# Patient Record
Sex: Male | Born: 1980 | Race: White | Hispanic: No | Marital: Single | Smoking: Former smoker
Health system: Southern US, Community
[De-identification: ages and names within clinical notes are randomized; demographics above are authoritative.]

## PROBLEM LIST (undated history)

## (undated) DIAGNOSIS — S8292XA Unspecified fracture of left lower leg, initial encounter for closed fracture: Secondary | ICD-10-CM

## (undated) DIAGNOSIS — I10 Essential (primary) hypertension: Secondary | ICD-10-CM

---

## 2007-08-30 ENCOUNTER — Emergency Department (HOSPITAL_COMMUNITY): Admission: EM | Admit: 2007-08-30 | Discharge: 2007-08-30 | Payer: Self-pay | Admitting: Emergency Medicine

## 2012-01-26 ENCOUNTER — Emergency Department (HOSPITAL_COMMUNITY)
Admission: EM | Admit: 2012-01-26 | Discharge: 2012-01-26 | Disposition: A | Discharge status: Home / Self Care | Payer: Worker's Compensation | Attending: Emergency Medicine | Admitting: Emergency Medicine

## 2012-01-26 ENCOUNTER — Encounter (HOSPITAL_COMMUNITY): Payer: Self-pay | Admitting: *Deleted

## 2012-01-26 DIAGNOSIS — F17200 Nicotine dependence, unspecified, uncomplicated: Secondary | ICD-10-CM | POA: Insufficient documentation

## 2012-01-26 DIAGNOSIS — W268XXA Contact with other sharp object(s), not elsewhere classified, initial encounter: Secondary | ICD-10-CM | POA: Insufficient documentation

## 2012-01-26 DIAGNOSIS — S61409A Unspecified open wound of unspecified hand, initial encounter: Secondary | ICD-10-CM | POA: Insufficient documentation

## 2012-01-26 DIAGNOSIS — Y929 Unspecified place or not applicable: Secondary | ICD-10-CM | POA: Insufficient documentation

## 2012-01-26 DIAGNOSIS — Y9389 Activity, other specified: Secondary | ICD-10-CM | POA: Insufficient documentation

## 2012-01-26 DIAGNOSIS — IMO0002 Reserved for concepts with insufficient information to code with codable children: Secondary | ICD-10-CM

## 2012-01-26 NOTE — ED Notes (Addendum)
Pt says cut his right hand on a tin can.

## 2012-01-26 NOTE — ED Provider Notes (Signed)
History   This chart was scribed for non-physician practitioner working with Hurman Horn, MD by Smitty Pluck. This patient was seen in room WTR5 and the patient's care was started at 10:55 PM.    CSN: [id number]  Arrival date & time 01/26/12  2227     Chief Complaint  Patient presents with  . Laceration     The history is provided by the patient. No language interpreter was used.   Jose Mclaughlin is a 32 y.o. male who presents to the Emergency Department complaining of right hand laceration today 10 minutes ago. Pt reports that he has mild right hand pain. He states he cut his right hand with tin can. Pt reports that his tetanus x3 years ago. . The bleeding is controlled. Pt denies reduced range of motion numbness or paresthesia.    History reviewed. No pertinent past medical history.  History reviewed. No pertinent past surgical history.  No family history on file.  History  Substance Use Topics  . Smoking status: Current Every Day Smoker  . Smokeless tobacco: Not on file  . Alcohol Use: Yes      Review of Systems  Constitutional: Negative for fever.  Respiratory: Negative for shortness of breath.   Cardiovascular: Negative for chest pain.  Gastrointestinal: Negative for nausea, vomiting, abdominal pain and diarrhea.  Skin: Positive for wound.  All other systems reviewed and are negative.    Allergies  Review of patient's allergies indicates not on file.  Home Medications  No current outpatient prescriptions on file.  BP 137/91  Pulse 80  Temp 98.2 F (36.8 C) (Oral)  Resp 16  SpO2 96%  Physical Exam  Nursing note and vitals reviewed. Constitutional: He is oriented to person, place, and time. He appears well-developed and well-nourished. No distress.  HENT:  Head: Normocephalic.  Eyes: Conjunctivae normal and EOM are normal.  Cardiovascular: Normal rate.   Pulmonary/Chest: Effort normal. No stridor.  Musculoskeletal: Normal range of motion.    Neurological: He is alert and oriented to person, place, and time.  Skin:       3.5 cm laceration with full thickness and exposed subcutaneous tissue. Full rom, flexion and extension Distal sensation intact Cap refill less than 3 seconds    Psychiatric: He has a normal mood and affect.    ED Course  Procedures (including critical care time) DIAGNOSTIC STUDIES: Oxygen Saturation is 96% on room air, adequate by my interpretation.    COORDINATION OF CARE: 10:54 PM Discussed ED treatment with pt and pt agrees.   LACERATION REPAIR Performed by: Wynetta Emery Authorized by: Wynetta Emery Consent: Verbal consent obtained. Risks and benefits: risks, benefits and alternatives were discussed Consent given by: patient Patient identity confirmed: Wrist band  Prepped and Draped in normal sterile fashion  Tetanus: Up to date   Laceration Location: Dorsum of right hand  Laceration Length: 3.5 cm  Anesthesia: Local   Local anesthetic: 2% with epinephrine  Anesthetic total: 4 ml  Irrigation method: syringe  Amount of cleaning: copious   Wound explored to depth in good light on a bloodless field with no foreign bodies seen or palpated.   Skin closure: 5-0 polypropylene   Number of sutures: 6   Technique: Running locking   Patient tolerance: Patient tolerated the procedure well with no immediate complications.  Antibx ointment applied. Instructions for care discussed verbally and patient provided with additional written instructions for homecare and f/u.     Labs Reviewed - No data to  display No results found.   1. Laceration       MDM   laceration to dorsum of right hand with no structural involvement. Closed with 6 running locking sutures. Return to questions given    I personally performed the services described in this documentation, which was scribed in my presence. The recorded information has been reviewed and is accurate.   Wynetta Emery,  PA-C 01/27/12 [phone]

## 2012-01-26 NOTE — ED Notes (Signed)
PA at bedside for suturing procedure 

## 2012-01-28 NOTE — ED Provider Notes (Signed)
Medical screening examination/treatment/procedure(s) were performed by non-physician practitioner and as supervising physician I was immediately available for consultation/collaboration.  Hurman Horn, MD 01/28/12 1350

## 2012-02-05 ENCOUNTER — Encounter (HOSPITAL_COMMUNITY): Payer: Self-pay | Admitting: *Deleted

## 2012-02-05 ENCOUNTER — Emergency Department (HOSPITAL_COMMUNITY)
Admission: EM | Admit: 2012-02-05 | Discharge: 2012-02-05 | Disposition: A | Discharge status: Home / Self Care | Payer: Worker's Compensation | Attending: Emergency Medicine | Admitting: Emergency Medicine

## 2012-02-05 DIAGNOSIS — F17200 Nicotine dependence, unspecified, uncomplicated: Secondary | ICD-10-CM | POA: Insufficient documentation

## 2012-02-05 DIAGNOSIS — Z4802 Encounter for removal of sutures: Secondary | ICD-10-CM | POA: Insufficient documentation

## 2012-02-05 NOTE — ED Provider Notes (Signed)
Medical screening examination/treatment/procedure(s) were performed by non-physician practitioner and as supervising physician I was immediately available for consultation/collaboration.   Gwyneth Sprout, MD 02/05/12 2026

## 2012-02-05 NOTE — ED Provider Notes (Signed)
History     CSN: [id number]  Arrival date & time 02/05/12  1314   First MD Initiated Contact with Patient 02/05/12 1327      No chief complaint on file.   (Consider location/radiation/quality/duration/timing/severity/associated sxs/prior treatment) Patient is a 32 y.o. male presenting with suture removal. The history is provided by the patient.  Suture / Staple Removal  Treated in ED: 10 days go. There has been no treatment since the wound repair. Fever duration: no fever. There has been no drainage from the wound. There is no redness present. There is no swelling present. The pain has no pain. Difficulty Moving Extremity/Digit: No difficulty moving all fingers of the right hand.    History reviewed. No pertinent past medical history.  History reviewed. No pertinent past surgical history.  History reviewed. No pertinent family history.  History  Substance Use Topics  . Smoking status: Current Every Day Smoker  . Smokeless tobacco: Not on file  . Alcohol Use: Yes      Review of Systems  Constitutional: Negative for fever and chills.  Skin: Positive for wound. Negative for color change.  Neurological: Negative for numbness.  All other systems reviewed and are negative.    Allergies  Review of patient's allergies indicates no known allergies.  Home Medications   Current Outpatient Rx  Name  Route  Sig  Dispense  Refill  . IBUPROFEN 200 MG PO TABS   Oral   Take 800 mg by mouth every 6 (six) hours as needed.           BP 128/77  Pulse 82  Temp 98 F (36.7 C) (Oral)  Resp 18  SpO2 97%  Physical Exam  Nursing note and vitals reviewed. Constitutional: He appears well-developed and well-nourished. No distress.  HENT:  Head: Normocephalic and atraumatic.  Mouth/Throat: Oropharynx is clear and moist.  Neck: Normal range of motion. Neck supple.  Cardiovascular: Normal rate, regular rhythm and normal heart sounds.   Pulmonary/Chest: Effort normal and breath  sounds normal.  Neurological: He is alert.  Skin: Skin is warm and dry. He is not diaphoretic.       Laceration of right hand appears to be well healed.  Good approximation.  No surrounding erythema, edema, warmth, or induration.  No drainage.  Psychiatric: He has a normal mood and affect.    ED Course  Procedures (including critical care time)  Labs Reviewed - No data to display No results found.   No diagnosis found.    MDM  Patient presenting to have sutures removed.  No signs of infection.  Sutures removed without difficulty.  Laceration well healed.          Pascal Lux [place], PA-C 02/05/12 1712

## 2012-02-05 NOTE — ED Notes (Signed)
Pt needs stiches removal

## 2014-06-06 ENCOUNTER — Emergency Department (HOSPITAL_COMMUNITY)
Admission: EM | Admit: 2014-06-06 | Discharge: 2014-06-07 | Disposition: A | Discharge status: Home / Self Care | Payer: Worker's Compensation | Attending: Emergency Medicine | Admitting: Emergency Medicine

## 2014-06-06 ENCOUNTER — Encounter (HOSPITAL_COMMUNITY): Payer: Self-pay | Admitting: Emergency Medicine

## 2014-06-06 DIAGNOSIS — Y939 Activity, unspecified: Secondary | ICD-10-CM | POA: Insufficient documentation

## 2014-06-06 DIAGNOSIS — S61212A Laceration without foreign body of right middle finger without damage to nail, initial encounter: Secondary | ICD-10-CM | POA: Diagnosis not present

## 2014-06-06 DIAGNOSIS — Y990 Civilian activity done for income or pay: Secondary | ICD-10-CM | POA: Insufficient documentation

## 2014-06-06 DIAGNOSIS — Z720 Tobacco use: Secondary | ICD-10-CM | POA: Insufficient documentation

## 2014-06-06 DIAGNOSIS — Y288XXA Contact with other sharp object, undetermined intent, initial encounter: Secondary | ICD-10-CM | POA: Insufficient documentation

## 2014-06-06 DIAGNOSIS — Y929 Unspecified place or not applicable: Secondary | ICD-10-CM | POA: Insufficient documentation

## 2014-06-06 MED ORDER — LIDOCAINE HCL (PF) 1 % IJ SOLN
5.0000 mL | Freq: Once | INTRAMUSCULAR | Status: AC
  Administered 2014-06-07: 5 mL via INTRADERMAL
  Filled 2014-06-06: qty 5

## 2014-06-06 NOTE — ED Provider Notes (Signed)
CSN: [id number]     Arrival date & time 06/06/14  2248 History   None   This chart was scribed for non-physician practitioner, Emilia Beck, PA-C, working with Geoffery Lyons, MD by Marica Otter, ED Scribe. This patient was seen in room [facility] and the patient's care was started at 11:27 PM.  No chief complaint on file.  The history is provided by the patient. No language interpreter was used.   PCP: No primary care provider on file. HPI Comments: Jose Mclaughlin is a 34 y.o. male who presents to the Emergency Department complaining of laceration to the right, middle finger sustained today when pt cut his finger on a metal equipment at work. Pt also complains of associated non-radiating sharp pain. Pt denies any other injury or Sx at this time.   No past medical history on file. No past surgical history on file. No family history on file. History  Substance Use Topics  . Smoking status: Current Every Day Smoker  . Smokeless tobacco: Not on file  . Alcohol Use: Yes    Review of Systems  Musculoskeletal:       Pain to right middle finger  Skin: Positive for wound (laceration to right, middle finger ).  All other systems reviewed and are negative.     Allergies  Review of patient's allergies indicates no known allergies.  Home Medications   Prior to Admission medications   Medication Sig Start Date End Date Taking? Authorizing Provider  ibuprofen (ADVIL,MOTRIN) 200 MG tablet Take 800 mg by mouth every 6 (six) hours as needed.    Historical Provider, MD   Triage Vitals: BP 135/78 mmHg  Pulse 79  Temp(Src) 98.2 F (36.8 C) (Oral)  Resp 16  SpO2 97% Physical Exam  Constitutional: He is oriented to person, place, and time. He appears well-developed and well-nourished. No distress.  HENT:  Head: Normocephalic and atraumatic.  Eyes: Conjunctivae and EOM are normal.  Neck: Neck supple. No tracheal deviation present.  Cardiovascular: Normal rate and regular rhythm.  Exam  reveals no gallop and no friction rub.   No murmur heard. Pulmonary/Chest: Effort normal and breath sounds normal. No respiratory distress. He has no wheezes. He has no rales. He exhibits no tenderness.  Abdominal: Soft. He exhibits no distension. There is no tenderness. There is no rebound.  Musculoskeletal: Normal range of motion.  Full active ROM of the right middle finger. No obvious deformity.   Neurological: He is alert and oriented to person, place, and time.  Speech is goal-oriented. Moves limbs without ataxia.   Skin: Skin is warm and dry.  2cm laceration of the dorsal right middle finger with bleeding controlled.   Psychiatric: He has a normal mood and affect. His behavior is normal.  Nursing note and vitals reviewed.   ED Course  Procedures (including critical care time) DIAGNOSTIC STUDIES: Oxygen Saturation is 97% on RA, nl by my interpretation.    COORDINATION OF CARE: 11:27 PM-Discussed treatment plan which includes laceration repair  LACERATION REPAIR Performed by: Emilia Beck Authorized by: Emilia Beck Consent: Verbal consent obtained. Risks and benefits: risks, benefits and alternatives were discussed Consent given by: patient Patient identity confirmed: provided demographic data Prepped and Draped in normal sterile fashion Wound explored  Laceration Location: dorsal right middle finger  Laceration Length: 2cm  No Foreign Bodies seen or palpated  Anesthesia: local infiltration  Local anesthetic: lidocaine 1% without epinephrine  Anesthetic total: 2 ml  Irrigation method: syringe Amount of cleaning: standard  Skin closure: 4-0 prolene  Number of sutures: 2  Technique: simple  Patient tolerance: Patient tolerated the procedure well with no immediate complications.   Labs Review Labs Reviewed - No data to display  Imaging Review No results found.   EKG Interpretation None      MDM   Final diagnoses:  Laceration of right  middle finger w/o foreign body w/o damage to nail, initial encounter    12:42 AM Patient's laceration repaired without difficulty. No neurovascular compromise. Tetanus UTD. No other injury.   I personally performed the services described in this documentation, which was scribed in my presence. The recorded information has been reviewed and is accurate.    Emilia BeckKaitlyn Naziyah Tieszen, PA-C 06/07/14 0045  Geoffery Lyonsouglas Delo, MD 06/09/14 [phone]

## 2014-06-06 NOTE — ED Notes (Signed)
Bed: [facility]B Expected date:  Expected time:  Means of arrival:  Comments: 

## 2014-06-06 NOTE — ED Notes (Signed)
Patient states he bumped his hand into a piece of equipment at work and cut his hand open "pretty good". Hand is currently bandaged. Family at bedside. While setting in triage patient experienced +LOC for approx 20-30 seconds. Family at bedside states patient lost control of his head and struck it against the wall a few times. Patient alert & oriented at this time. Patient was drinking a beverage on arrival to treatment area, patient was requested to not drink any additional beverages at this time.

## 2014-06-07 MED ORDER — CEPHALEXIN 500 MG PO CAPS: 500.0000 mg | ORAL_CAPSULE | Freq: Four times a day (QID) | ORAL | Status: DC

## 2014-06-07 NOTE — Discharge Instructions (Signed)
Take keflex as directed until gone. Refer to attached documents for more information.

## 2014-06-07 NOTE — ED Notes (Signed)
PA student at bedside for lac repair.

## 2015-01-23 ENCOUNTER — Emergency Department (HOSPITAL_COMMUNITY): Payer: Self-pay

## 2015-01-23 ENCOUNTER — Emergency Department (HOSPITAL_COMMUNITY)
Admission: EM | Admit: 2015-01-23 | Discharge: 2015-01-23 | Disposition: A | Discharge status: Home / Self Care | Payer: Self-pay | Attending: Emergency Medicine | Admitting: Emergency Medicine

## 2015-01-23 ENCOUNTER — Encounter (HOSPITAL_COMMUNITY): Payer: Self-pay | Admitting: *Deleted

## 2015-01-23 DIAGNOSIS — S82832A Other fracture of upper and lower end of left fibula, initial encounter for closed fracture: Secondary | ICD-10-CM

## 2015-01-23 DIAGNOSIS — Y998 Other external cause status: Secondary | ICD-10-CM | POA: Insufficient documentation

## 2015-01-23 DIAGNOSIS — Y92828 Other wilderness area as the place of occurrence of the external cause: Secondary | ICD-10-CM | POA: Insufficient documentation

## 2015-01-23 DIAGNOSIS — W010XXA Fall on same level from slipping, tripping and stumbling without subsequent striking against object, initial encounter: Secondary | ICD-10-CM | POA: Insufficient documentation

## 2015-01-23 DIAGNOSIS — F17210 Nicotine dependence, cigarettes, uncomplicated: Secondary | ICD-10-CM | POA: Insufficient documentation

## 2015-01-23 DIAGNOSIS — Z792 Long term (current) use of antibiotics: Secondary | ICD-10-CM | POA: Insufficient documentation

## 2015-01-23 DIAGNOSIS — Y9301 Activity, walking, marching and hiking: Secondary | ICD-10-CM | POA: Insufficient documentation

## 2015-01-23 MED ORDER — HYDROCODONE-ACETAMINOPHEN 5-325 MG PO TABS
1.0000 | ORAL_TABLET | Freq: Once | ORAL | Status: AC
  Administered 2015-01-23: 1 via ORAL
  Filled 2015-01-23: qty 1

## 2015-01-23 MED ORDER — HYDROCODONE-ACETAMINOPHEN 5-325 MG PO TABS: 2.0000 | ORAL_TABLET | ORAL | Status: DC | PRN

## 2015-01-23 NOTE — ED Notes (Signed)
To x-ray

## 2015-01-23 NOTE — Progress Notes (Signed)
Orthopedic Tech Progress Note Patient Details:  Jose Mclaughlin August 12, 1980 [id number]  Ortho Devices Type of Ortho Device: Ace wrap, Stirrup splint Ortho Device/Splint Location: LLE Ortho Device/Splint Interventions: Ordered, Application   Jennye Moccasin 01/23/2015, 7:16 PM

## 2015-01-23 NOTE — Discharge Instructions (Signed)
Fibular Fracture With Rehab °The fibula is the smaller of the two lower leg bones and is vulnerable to breaks (fracture). Fibular fractures may go fully through the bone (complete) or partially (incomplete). The bone fragments are rarely out of alignment (displaced fracture). Fibula fractures may occur anywhere along the bone. However, this document only discusses fractures that do not involve a leg joint. Fibular fractures are not often a severe injury because the bone supports only about 17% of the body weight. °SYMPTOMS  °· Moderate to severe pain in the lower leg. °· Tenderness and swelling in the leg or calf. °· Bleeding and/or bruising (contusion) in the leg. °· Inability to bear weight on the injured extremity. °· Visible deformity, if the fracture is displaced. °· Numbness and coldness in the leg and foot, beyond the fracture site, if blood supply is impaired. °CAUSES  °Fractures occur when a force is placed on the bone that is greater than it can withstand. Common causes of fibular fracture include: °· Direct hit (trauma) (i.e., hockey or lacrosse check to the lower leg). °· Stress fracture (weakening of the bone from repeated stress). °· Indirect injury, caused by twisting, turning quickly, or violent muscle contraction. °RISK INCREASES WITH: °· Contact sports (i.e., football, soccer, lacrosse, hockey). °· Sports that can cause twisted ankle injury (i.e., skiing, basketball). °· Bony abnormalities (i.e., osteoporosis or bone tumors). °· Metabolism disorders, hormone problems, and nutrition deficiency and disorders (i.e., anorexia and bulimia). °· Poor strength and flexibility. °PREVENTION  °· Warm up and stretch properly before activity. °· Maintain physical fitness: °¨ Strength, flexibility, and endurance. °¨ Cardiovascular fitness. °· Wear properly fitted and padded protective equipment (i.e., shin guards for soccer). °PROGNOSIS  °If treated properly, fibular fractures usually heal in 4 to 6 weeks.    °RELATED COMPLICATIONS  °· Failure of bone to heal (nonunion). °· Bone heals in a poor position (malunion). °· Increased pressure inside the leg (compartment syndrome) due to injury that disrupts the blood supply to the leg and foot and injures the nerves and muscles (uncommon). °· Shortening of the injured bones. °· Hindrance of normal bone growth in children. °· Risks of surgery: infection, bleeding, injury to nerves (numbness, weakness, paralysis), need for further surgery. °· Longer healing time if activity is resumed too soon. °TREATMENT °Treatment first involves ice, medicine, and elevation of the leg to reduce pain and inflammation. People with fibular fractures are advised to walk using crutches. A brace or walking boot may be given to restrain the injured leg and allow for healing. Sometimes, surgery is needed to place a rod, plate, or screws in the bones in order to fix the fracture. After surgery, the leg is restrained. After restraint (with or without surgery), it is important to complete strengthening and stretching exercises to regain strength and a full range of motion. Exercises may be completed at home or with a therapist. °MEDICATION  °· If pain medicine is needed, nonsteroidal anti-inflammatory medicines (aspirin and ibuprofen), or other minor pain relievers (acetaminophen), are often advised. °· Do not take pain medicine for 7 days before surgery. °· Prescription pain relievers may be given if your health care provider thinks they are needed. Use only as directed and only as much as you need. °SEEK MEDICAL CARE IF: °· Symptoms get worse or do not improve in 2 weeks, despite treatment. °· The following occur after restraint or surgery. (Report any of these signs immediately): °¨ Swelling above or below the fracture site. °¨ Severe, persistent pain. °¨   Blue or gray skin below the fracture site, especially under the toenails. Numbness or loss of feeling below the fracture site. °¨ New, unexplained  symptoms develop. (Drugs used in treatment may produce side effects.) °EXERCISES  °RANGE OF MOTION (ROM) AND STRETCHING EXERCISES - Fibular Fracture °These exercises may help you when beginning to recover from your injury. Your symptoms may go away with or without further involvement from your physician, physical therapist or athletic trainer. While completing these exercises, remember:  °· Restoring tissue flexibility helps normal motion to return to the joints. This allows healthier, less painful movement and activity. °· An effective stretch should be held for at least 30 seconds. °· A stretch should never be painful. You should only feel a gentle lengthening or release in the stretched tissue. °RANGE OF MOTION - Dorsi/Plantar Flexion °· While sitting with your right / left knee straight, draw the top of your foot upwards by flexing your ankle. Then reverse the motion, pointing your toes downward. °· Hold each position for __________ seconds. °· After completing your first set of exercises, repeat this exercise with your knee bent. °Repeat __________ times. Complete this exercise __________ times per day.  °STRETCH - Gastrocsoleus  °· Sit with your right / left leg extended. Holding onto both ends of a belt or towel, loop it around the ball of your foot. °· Keeping your right / left ankle and foot relaxed and your knee straight, pull your foot and ankle toward you using the belt. °· You should feel a gentle stretch behind your calf or knee. Hold this position for __________ seconds. °Repeat __________ times. Complete this stretch __________ times per day.  °RANGE OF MOTION- Ankle Plantar Flexion  °· Sit with your right / left leg crossed over your opposite knee. °· Use your opposite hand to pull the top of your foot and toes toward you. °· You should feel a gentle stretch on the top of your foot and ankle. Hold this position for __________ seconds. °Repeat __________ times. Complete __________ times per day.    °RANGE OF MOTION - Ankle Eversion °· Sit with your right / left ankle crossed over your opposite knee. °· Grip your foot with your opposite hand, placing your thumb on the top of your foot and your fingers across the bottom of your foot. °· Gently push your foot downward with a slight rotation so your littlest toes rise slightly toward the ceiling. °· You should feel a gentle stretch on the inside of your ankle. Hold the stretch for __________ seconds. °Repeat __________ times. Complete this exercise __________ times per day.  °RANGE OF MOTION - Ankle Inversion °· Sit with your right / left ankle crossed over your opposite knee. °· Grip your foot with your opposite hand, placing your thumb on the bottom of your foot and your fingers across the top of your foot. °· Gently pull your foot so the smallest toe comes toward you and your thumb pushes the inside of the ball of your foot away from you. °· You should feel a gentle stretch on the outside of your ankle. Hold the stretch for __________ seconds. °Repeat __________ times. Complete this exercise __________ times per day.  °RANGE OF MOTION - Ankle Alphabet °· Imagine your right / left big toe is a pen. °· Keeping your hip and knee still, write out the entire alphabet with your "pen." Make the letters as large as you can, without increasing any discomfort. °Repeat __________ times. Complete this exercise   __________ times per day.  °RANGE OF MOTION - Ankle Dorsiflexion, Active Assisted °· Remove your shoes and sit on a chair, preferably not on a carpeted surface. °· Place your right / left foot on the floor, directly under your knee. Extend your opposite leg for support. °· Keeping your heel down, slide your right / left foot back toward the chair, until you feel a stretch at your ankle or calf. If you do not feel a stretch, slide your bottom forward to the edge of the chair, while still keeping your heel down. °· Hold this stretch for __________ seconds. °Repeat  __________ times. Complete this stretch __________ times per day.  °STRENGTHENING EXERCISES - Fibular Fracture °These exercises may help you when beginning to recover from your injury. They may resolve your symptoms with or without further involvement from your physician, physical therapist or athletic trainer. While completing these exercises, remember:  °· Muscles can gain both the endurance and the strength needed for everyday activities through controlled exercises. °· Complete these exercises as instructed by your physician, physical therapist or athletic trainer. Increase the resistance and repetitions only as guided. °· You may experience muscle soreness or fatigue, but the pain or discomfort you are trying to eliminate should never worsen during these exercises. If this pain does get worse, stop and make certain you are following the directions exactly. If the pain is still present after adjustments, discontinue the exercise until you can discuss the trouble with your clinician. °STRENGTH - Dorsiflexors °· Secure a rubber exercise band or tubing to a fixed object (table, pole) and loop the other end around your right / left foot. °· Sit on the floor, facing the fixed object. The band should be slightly tense when your foot is relaxed. °· Slowly draw your foot back toward you, using your ankle and toes. °· Hold this position for __________ seconds. Slowly release the tension in the band and return your foot to the starting position. °Repeat __________ times. Complete this exercise __________ times per day.  °STRENGTH - Plantar-flexors °· Sit with your right / left leg extended. Holding onto both ends of a rubber exercise band or tubing, loop it around the ball of your foot. Keep a slight tension in the band. °· Slowly push your toes away from you, pointing them downward. °· Hold this position for __________ seconds. Return to the starting position slowly, controlling the tension in the band. °Repeat  __________ times. Complete this exercise __________ times per day.  °STRENGTH - Plantar-flexors, Standing  °· Stand with your feet shoulder width apart. Place your hands on a wall or table to steady yourself, using as little support as needed. °· Keeping your weight evenly spread over the width of your feet, rise up on your toes.* °· Hold this position for __________ seconds. °Repeat __________ times. Complete this exercise __________ times per day.  °*If this is too easy, shift your weight toward your right / left leg until you feel challenged. Ultimately, you may be asked to do this exercise while standing on your right / left foot only. °STRENGTH - Towel Curls °· Sit in a chair, on a non-carpeted surface. °· Place your foot on a towel, keeping your heel on the floor. °· Pull the towel toward your heel only by curling your toes. Keep your heel on the floor. °· If instructed by your physician, physical therapist or athletic trainer, add ____________________ at the end of the towel. °Repeat __________ times. Complete this exercise __________   times per day. STRENGTH - Ankle Eversion  Secure one end of a rubber exercise band or tubing to a fixed object (table, pole). Loop the other end around your foot, just before your toes.  Place your fists between your knees. This will focus your strengthening at your ankle.  Drawing the band across your opposite foot, away from the pole, slowly pull your little toe out and up. Make sure the band is positioned to resist the entire motion.  Hold this position for __________ seconds.  Return to the starting position slowly, controlling the tension in the band. Repeat __________ times. Complete this exercise __________ times per day.  STRENGTH - Ankle Inversion  Secure one end of a rubber exercise band or tubing to a fixed object (table, pole). Loop the other end around your foot, just before your toes.  Place your fists between your knees. This will focus your  strengthening at your ankle.  Slowly, pull your big toe up and in, making sure the band is positioned to resist the entire motion.  Hold this position for __________ seconds.  Return to the starting position slowly, controlling the tension in the band. Repeat __________ times. Complete this exercises __________ times per day.    This information is not intended to replace advice given to you by your health care provider. Make sure you discuss any questions you have with your health care provider.   Document Released: 12/18/2004 Document Revised: 01/08/2014 Document Reviewed: 04/01/2008 Elsevier Interactive Patient Education 2016 [place] or Splint Care Casts and splints support injured limbs and keep bones from moving while they heal. It is important to care for your cast or splint at home.  HOME CARE INSTRUCTIONS  Keep the cast or splint uncovered during the drying period. It can take 24 to 48 hours to dry if it is made of plaster. A fiberglass cast will dry in less than 1 hour.  Do not rest the cast on anything harder than a pillow for the first 24 hours.  Do not put weight on your injured limb or apply pressure to the cast until your health care provider gives you permission.  Keep the cast or splint dry. Wet casts or splints can lose their shape and may not support the limb as well. A wet cast that has lost its shape can also create harmful pressure on your skin when it dries. Also, wet skin can become infected.  Cover the cast or splint with a plastic bag when bathing or when out in the rain or snow. If the cast is on the trunk of the body, take sponge baths until the cast is removed.  If your cast does become wet, dry it with a towel or a blow dryer on the cool setting only.  Keep your cast or splint clean. Soiled casts may be wiped with a moistened cloth.  Do not place any hard or soft foreign objects under your cast or splint, such as cotton, toilet paper, lotion,  or powder.  Do not try to scratch the skin under the cast with any object. The object could get stuck inside the cast. Also, scratching could lead to an infection. If itching is a problem, use a blow dryer on a cool setting to relieve discomfort.  Do not trim or cut your cast or remove padding from inside of it.  Exercise all joints next to the injury that are not immobilized by the cast or splint. For example, if you have  a long leg cast, exercise the hip joint and toes. If you have an arm cast or splint, exercise the shoulder, elbow, thumb, and fingers.  Elevate your injured arm or leg on 1 or 2 pillows for the first 1 to 3 days to decrease swelling and pain.It is best if you can comfortably elevate your cast so it is higher than your heart. SEEK MEDICAL CARE IF:   Your cast or splint cracks.  Your cast or splint is too tight or too loose.  You have unbearable itching inside the cast.  Your cast becomes wet or develops a soft spot or area.  You have a bad smell coming from inside your cast.  You get an object stuck under your cast.  Your skin around the cast becomes red or raw.  You have new pain or worsening pain after the cast has been applied. SEEK IMMEDIATE MEDICAL CARE IF:   You have fluid leaking through the cast.  You are unable to move your fingers or toes.  You have discolored (blue or white), cool, painful, or very swollen fingers or toes beyond the cast.  You have tingling or numbness around the injured area.  You have severe pain or pressure under the cast.  You have any difficulty with your breathing or have shortness of breath.  You have chest pain.   This information is not intended to replace advice given to you by your health care provider. Make sure you discuss any questions you have with your health care provider.   Follow-up with orthopedic surgery as soon as possible for consultation and reevaluation. Avoid weightbearing on your left foot. Keep leg  elevated. Apply ice to affected area. Return to the emergency department if you experience severe increase in your pain, severe increase in swelling, chest pain, shortness of breath.

## 2015-01-23 NOTE — ED Notes (Signed)
The pt is c/o lt ankle pain.  He was just walking just pta and he felt a pop  Sl swelling medially.  He has not walked on it since

## 2015-01-23 NOTE — ED Notes (Signed)
Pt to xray and back to room

## 2015-01-26 NOTE — ED Provider Notes (Signed)
CSN: [id number]     Arrival date & time 01/23/15  1601 History   First MD Initiated Contact with Patient 01/23/15 1705     Chief Complaint  Patient presents with  . Ankle Injury     (Consider location/radiation/quality/duration/timing/severity/associated sxs/prior Treatment) HPI   Jose Mclaughlin is a 35 y.o M with no significant pmhx who presents to the ED today c/o L ankle pain. Pt states that he was walking down a hill approximately 1 hr PTA when he slipped and fell onto his left ankle. Pt states that his ankle inverted and he heard a pop. Pt has not been able to bear weight on his L ankle since the injury due to pain. No obvious swelling. No numbness/paresthesias or discoloration.   History reviewed. No pertinent past medical history. History reviewed. No pertinent past surgical history. No family history on file. Social History  Substance Use Topics  . Smoking status: Current Every Day Smoker -- 0.30 packs/day    Types: Cigarettes  . Smokeless tobacco: None  . Alcohol Use: 10.8 oz/week    18 Cans of beer per week     Comment: 2-3 beers daily    Review of Systems  All other systems reviewed and are negative.     Allergies  Review of patient's allergies indicates no known allergies.  Home Medications   Prior to Admission medications   Medication Sig Start Date End Date Taking? Authorizing Provider  cephALEXin (KEFLEX) 500 MG capsule Take 1 capsule (500 mg total) by mouth 4 (four) times daily. 06/07/14   Emilia Beck, PA-C  HYDROcodone-acetaminophen (NORCO/VICODIN) 5-325 MG tablet Take 2 tablets by mouth every 4 (four) hours as needed. 01/23/15   Breeanne Oblinger Tripp Dejan Angert, PA-C  ibuprofen (ADVIL,MOTRIN) 200 MG tablet Take 600 mg by mouth every 6 (six) hours as needed for moderate pain.     Historical Provider, MD   BP 143/89 mmHg  Pulse 78  Temp(Src) 98.1 F (36.7 C) (Oral)  Resp 20  SpO2 98% Physical Exam  Constitutional: He is oriented to person, place, and time.  He appears well-developed and well-nourished. No distress.  HENT:  Head: Normocephalic and atraumatic.  Eyes: Conjunctivae are normal. Right eye exhibits no discharge. Left eye exhibits no discharge. No scleral icterus.  Cardiovascular: Normal rate.   Pulmonary/Chest: Effort normal.  Musculoskeletal: Normal range of motion. He exhibits tenderness. He exhibits no edema.  TTP over L lateral malleolus. No pain with dorsi or plantar flexion. No edema or ecchymosis. No obvious bony deformity.   Neurological: He is alert and oriented to person, place, and time. Coordination normal.  Strength 5/5 throughout. No sensory deficits.    Skin: Skin is warm and dry. No rash noted. He is not diaphoretic. No erythema. No pallor.  Psychiatric: He has a normal mood and affect. His behavior is normal.  Nursing note and vitals reviewed.   ED Course  Procedures (including critical care time) Labs Review Labs Reviewed - No data to display  Imaging Review No results found. I have personally reviewed and evaluated these images and lab results as part of my medical decision-making.   EKG Interpretation None      MDM   Final diagnoses:  Fracture of distal end of left fibula    Patient X-Ray reveals nondisplaced distal fibular shaft fx.. No pain at proximal tibia. Pt is neurovascularly intact. Pain managed in ED. Pt placed in U-splint and given crutches. Pt advised to follow up with orthopedics, referral given. RICE therapy recommended  and discussed. Patient will be dc home & is agreeable with above plan.     Lester Kinsman [place], PA-C 01/26/15 [id number]  Lavera Guise, MD 01/26/15 [phone]

## 2015-03-20 ENCOUNTER — Emergency Department (HOSPITAL_COMMUNITY): Payer: Self-pay

## 2015-03-20 ENCOUNTER — Encounter (HOSPITAL_COMMUNITY): Payer: Self-pay | Admitting: Emergency Medicine

## 2015-03-20 ENCOUNTER — Emergency Department (HOSPITAL_COMMUNITY)
Admission: EM | Admit: 2015-03-20 | Discharge: 2015-03-21 | Disposition: A | Discharge status: Home / Self Care | Payer: Self-pay | Attending: Emergency Medicine | Admitting: Emergency Medicine

## 2015-03-20 DIAGNOSIS — F17210 Nicotine dependence, cigarettes, uncomplicated: Secondary | ICD-10-CM | POA: Insufficient documentation

## 2015-03-20 DIAGNOSIS — R0600 Dyspnea, unspecified: Secondary | ICD-10-CM | POA: Insufficient documentation

## 2015-03-20 DIAGNOSIS — Z8781 Personal history of (healed) traumatic fracture: Secondary | ICD-10-CM | POA: Insufficient documentation

## 2015-03-20 DIAGNOSIS — R42 Dizziness and giddiness: Secondary | ICD-10-CM | POA: Insufficient documentation

## 2015-03-20 DIAGNOSIS — R9431 Abnormal electrocardiogram [ECG] [EKG]: Secondary | ICD-10-CM | POA: Insufficient documentation

## 2015-03-20 DIAGNOSIS — R55 Syncope and collapse: Secondary | ICD-10-CM | POA: Insufficient documentation

## 2015-03-20 HISTORY — DX: Unspecified fracture of left lower leg, initial encounter for closed fracture: S82.92XA

## 2015-03-20 LAB — BASIC METABOLIC PANEL
Anion gap: 11 (ref 5–15)
BUN: 10 mg/dL (ref 6–20)
CHLORIDE: 106 mmol/L (ref 101–111)
CO2: 23 mmol/L (ref 22–32)
Calcium: 8.8 mg/dL — ABNORMAL LOW (ref 8.9–10.3)
Creatinine, Ser: 0.87 mg/dL (ref 0.61–1.24)
GFR calc Af Amer: >60 mL/min (ref >60)
GLUCOSE: 143 mg/dL — AB (ref 65–99)
POTASSIUM: 3.3 mmol/L — AB (ref 3.5–5.1)
Sodium: 140 mmol/L (ref 135–145)

## 2015-03-20 LAB — CBC
HEMATOCRIT: 41.5 % (ref 39.0–52.0)
HEMOGLOBIN: 13.8 g/dL (ref 13.0–17.0)
MCH: 29.7 pg (ref 26.0–34.0)
MCHC: 33.3 g/dL (ref 30.0–36.0)
MCV: 89.2 fL (ref 78.0–100.0)
Platelets: 179 10*3/uL (ref 150–400)
RBC: 4.65 MIL/uL (ref 4.22–5.81)
RDW: 13 % (ref 11.5–15.5)
WBC: 6.6 10*3/uL (ref 4.0–10.5)

## 2015-03-20 LAB — I-STAT TROPONIN, ED: Troponin i, poc: 0 ng/mL (ref 0.00–0.08)

## 2015-03-20 LAB — D-DIMER, QUANTITATIVE: D-Dimer, Quant: 0.57 ug/mL-FEU — ABNORMAL HIGH (ref 0.00–0.50)

## 2015-03-20 NOTE — ED Notes (Signed)
C/o dizziness, sob, and feeling anxious x 2 hours.  Denies pain.  Reports L lower leg fracture in January.  Decreased mobility since then- able to ambulate with boot on for the last 3 weeks.  Boot removed on Tuesday.

## 2015-03-21 ENCOUNTER — Encounter (HOSPITAL_COMMUNITY): Payer: Self-pay | Admitting: Radiology

## 2015-03-21 ENCOUNTER — Emergency Department (HOSPITAL_COMMUNITY): Payer: Self-pay

## 2015-03-21 MED ORDER — IOHEXOL 350 MG/ML SOLN
100.0000 mL | Freq: Once | INTRAVENOUS | Status: AC | PRN
  Administered 2015-03-21: 100 mL via INTRAVENOUS

## 2015-03-21 NOTE — ED Provider Notes (Addendum)
CSN: [id number]     Arrival date & time 03/20/15  2221 History  By signing my name below, I, Phillis Haggis, attest that this documentation has been prepared under the direction and in the presence of Derwood Kaplan, MD. Electronically Signed: Phillis Haggis, ED Scribe. 03/21/2015. 12:58 AM.  Chief Complaint  Patient presents with  . Shortness of Breath  . Dizziness   The history is provided by the patient. No language interpreter was used.  HPI Comments: Jose Mclaughlin is a 35 y.o. who presents to the Emergency Department complaining of dizzines and lightheadedness onset 4 hours ago. He reports that the dizziness began after eating and became anxious after looking up symptoms on the Internet. He does not know for sure if he was SOB or not. Pt reports that he fractured his lower left leg in January and recently stopped wearing his boot. He is fully weightbearing on the leg now. He reports hx of one syncopal episode while receiving stitches. He denies hx of liver disease, hx of heart disease, chest pain or SOB prior to today. Pt is a smoker, 5-6 cigarettes a day. He is adopted and does not know biological familial hx.   Past Medical History  Diagnosis Date  . Leg fracture, left    History reviewed. No pertinent past surgical history. No family history on file. Social History  Substance Use Topics  . Smoking status: Current Every Day Smoker -- 0.30 packs/day    Types: Cigarettes  . Smokeless tobacco: None  . Alcohol Use: 10.8 oz/week    18 Cans of beer per week     Comment: 2-3 beers daily    Review of Systems 10 Systems reviewed and all are negative for acute change except as noted in the HPI.  Allergies  Review of patient's allergies indicates no known allergies.  Home Medications   Prior to Admission medications   Medication Sig Start Date End Date Taking? Authorizing Provider  ibuprofen (ADVIL,MOTRIN) 200 MG tablet Take 600 mg by mouth every 6 (six) hours as needed for moderate  pain.    Yes Historical Provider, MD   BP 130/67 mmHg  Pulse 76  Temp(Src) 97.8 F (36.6 C) (Oral)  Resp 14  Ht  (1.905 m)  Wt 395 lb 9 oz (179.426 kg)  BMI 49.44 kg/m2  SpO2 88% Physical Exam  Constitutional: He is oriented to person, place, and time. He appears well-developed and well-nourished.  HENT:  Head: Normocephalic and atraumatic.  Eyes: EOM are normal. Pupils are equal, round, and reactive to light.  Neck: Normal range of motion. Neck supple.  Cardiovascular: Normal rate, regular rhythm and normal heart sounds.  Exam reveals no gallop and no friction rub.   No murmur heard. Pulmonary/Chest: Effort normal and breath sounds normal. He has no wheezes.  Lungs clear to auscultation  Abdominal: Soft. There is no tenderness.  Musculoskeletal: Normal range of motion.  Neurological: He is alert and oriented to person, place, and time.  Skin: Skin is warm and dry.  Psychiatric: He has a normal mood and affect. His behavior is normal.  Nursing note and vitals reviewed.   ED Course  Procedures (including critical care time) DIAGNOSTIC STUDIES: Oxygen Saturation is 98% on RA, normal by my interpretation.    COORDINATION OF CARE: 12:54 AM-Discussed treatment plan which includes labs and x-ray with pt at bedside and pt agreed to plan.    Labs Review Labs Reviewed  BASIC METABOLIC PANEL - Abnormal; Notable for the following:  Potassium 3.3 (*)    Glucose, Bld 143 (*)    Calcium 8.8 (*)    All other components within normal limits  D-DIMER, QUANTITATIVE (NOT AT [facility]) - Abnormal; Notable for the following:    D-Dimer, Quant 0.57 (*)    All other components within normal limits  CBC  I-STAT TROPOININ, ED    Imaging Review Ct Angio Chest Pe W/cm &/or Wo Cm  03/21/2015  CLINICAL DATA:  Dyspnea, onset tonight. EXAM: CT ANGIOGRAPHY CHEST WITH CONTRAST TECHNIQUE: Multidetector CT imaging of the chest was performed using the standard protocol during bolus administration  of intravenous contrast. Multiplanar CT image reconstructions and MIPs were obtained to evaluate the vascular anatomy. CONTRAST:  100 mL Omnipaque 350 intravenous COMPARISON:  Radiographs 03/20/2015 FINDINGS: Cardiovascular: There is good opacification of the pulmonary arteries. There is no pulmonary embolism. The thoracic aorta is normal in caliber and intact. Lungs: Clear Central airways: Patent Effusions: None Lymphadenopathy: None Esophagus: Unremarkable Upper abdomen: Small hiatal hernia Musculoskeletal: No significant abnormality Review of the MIP images confirms the above findings. IMPRESSION: Negative for acute pulmonary embolism.  Small hiatal hernia. Electronically Signed   By: Ellery Plunkaniel R Mitchell M.D.   On: 03/21/2015 02:31   I have personally reviewed and evaluated these images and lab results as part of my medical decision-making.   EKG Interpretation   Date/Time:  Sunday March 20 2015 22:39:31 EDT Ventricular Rate:  97 PR Interval:  178 QRS Duration: 108 QT Interval:  356 QTC Calculation: 452 R Axis:   -33 Text Interpretation:  Normal sinus rhythm with sinus arrhythmia Left axis  deviation Incomplete right bundle branch block Abnormal ECG questionable  [id number] Confirmed by Rhunette CroftNANAVATI, MD, Janey GentaANKIT [phone]) on 03/21/2015 12:45:56 AM  Also confirmed by Rhunette CroftNANAVATI, MD, Janey GentaANKIT [phone]([id number]), editor [place]  CCT,  [place] ([id number])  on 03/21/2015 7:19:45 AM      MDM   Final diagnoses:  Near syncope  Dyspnea  Abnormal EKG   I personally performed the services described in this documentation, which was scribed in my presence. The recorded information has been reviewed and is accurate.  Pt comes in with cc of dizziness with near fainting and possible DIB. Pt has hx of recent leg fracture. Dimer was ordered by the screening physician - and is +. Pt has tachycardia and non specific EKG changes. Will get CT PE. Will continue cardiac monitoring. Pt adopted - so some of the vital family hx is  missing, but it doesn't appear that he has had true syncope in the past or any cardiac disease. We will monitor on tele for 4 hours whilst here.    Derwood KaplanAnkit Keiosha Cancro, MD 03/21/15 [phone]  Derwood KaplanAnkit Savalas Monje, MD 03/23/15 [phone]

## 2015-03-21 NOTE — Discharge Instructions (Signed)
We saw you in the ER for the near fainting, shortness of breath. All of our cardiac workup is normal, including labs, EKG and chest X-RAY are normal. We are not sure what is causing your discomfort, but we feel comfortable sending you home at this time. The workup in the ER is not complete, and you should follow up with your primary care doctor for further evaluation.   Near-Syncope Near-syncope (commonly known as near fainting) is sudden weakness, dizziness, or feeling like you might pass out. During an episode of near-syncope, you may also develop pale skin, have tunnel vision, or feel sick to your stomach (nauseous). Near-syncope may occur when getting up after sitting or while standing for a long time. It is caused by a sudden decrease in blood flow to the brain. This decrease can result from various causes or triggers, most of which are not serious. However, because near-syncope can sometimes be a sign of something serious, a medical evaluation is required. The specific cause is often not determined. HOME CARE INSTRUCTIONS  Monitor your condition for any changes. The following actions may help to alleviate any discomfort you are experiencing:  Have someone stay with you until you feel stable.  Lie down right away and prop your feet up if you start feeling like you might faint. Breathe deeply and steadily. Wait until all the symptoms have passed. Most of these episodes last only a few minutes. You may feel tired for several hours.   Drink enough fluids to keep your urine clear or pale yellow.   If you are taking blood pressure or heart medicine, get up slowly when seated or lying down. Take several minutes to sit and then stand. This can reduce dizziness.  Follow up with your health care provider as directed. SEEK IMMEDIATE MEDICAL CARE IF:   You have a severe headache.   You have unusual pain in the chest, abdomen, or back.   You are bleeding from the mouth or rectum, or you have  black or tarry stool.   You have an irregular or very fast heartbeat.   You have repeated fainting or have seizure-like jerking during an episode.   You faint when sitting or lying down.   You have confusion.   You have difficulty walking.   You have severe weakness.   You have vision problems.  MAKE SURE YOU:   Understand these instructions.  Will watch your condition.  Will get help right away if you are not doing well or get worse.   This information is not intended to replace advice given to you by your health care provider. Make sure you discuss any questions you have with your health care provider.   Document Released: 12/18/2004 Document Revised: 12/23/2012 Document Reviewed: 05/23/2012 Elsevier Interactive Patient Education [employer]2016 Elsevier Inc.

## 2015-04-25 ENCOUNTER — Emergency Department (HOSPITAL_COMMUNITY)
Admission: EM | Admit: 2015-04-25 | Discharge: 2015-04-25 | Disposition: A | Discharge status: Home / Self Care | Payer: Self-pay | Attending: Emergency Medicine | Admitting: Emergency Medicine

## 2015-04-25 ENCOUNTER — Emergency Department (HOSPITAL_COMMUNITY): Payer: Self-pay

## 2015-04-25 ENCOUNTER — Encounter (HOSPITAL_COMMUNITY): Payer: Self-pay | Admitting: Emergency Medicine

## 2015-04-25 DIAGNOSIS — Z8781 Personal history of (healed) traumatic fracture: Secondary | ICD-10-CM | POA: Insufficient documentation

## 2015-04-25 DIAGNOSIS — R0789 Other chest pain: Secondary | ICD-10-CM | POA: Insufficient documentation

## 2015-04-25 DIAGNOSIS — F17210 Nicotine dependence, cigarettes, uncomplicated: Secondary | ICD-10-CM | POA: Insufficient documentation

## 2015-04-25 LAB — BASIC METABOLIC PANEL
ANION GAP: 10 (ref 5–15)
BUN: 10 mg/dL (ref 6–20)
CALCIUM: 9 mg/dL (ref 8.9–10.3)
CO2: 26 mmol/L (ref 22–32)
Chloride: 104 mmol/L (ref 101–111)
Creatinine, Ser: 0.83 mg/dL (ref 0.61–1.24)
GLUCOSE: 100 mg/dL — AB (ref 65–99)
POTASSIUM: 4.2 mmol/L (ref 3.5–5.1)
SODIUM: 140 mmol/L (ref 135–145)

## 2015-04-25 LAB — CBC
HEMATOCRIT: 44.8 % (ref 39.0–52.0)
HEMOGLOBIN: 14.8 g/dL (ref 13.0–17.0)
MCH: 29.8 pg (ref 26.0–34.0)
MCHC: 33 g/dL (ref 30.0–36.0)
MCV: 90.1 fL (ref 78.0–100.0)
Platelets: 190 10*3/uL (ref 150–400)
RBC: 4.97 MIL/uL (ref 4.22–5.81)
RDW: 13.4 % (ref 11.5–15.5)
WBC: 5.8 10*3/uL (ref 4.0–10.5)

## 2015-04-25 LAB — I-STAT TROPONIN, ED: Troponin i, poc: 0 ng/mL (ref 0.00–0.08)

## 2015-04-25 LAB — TROPONIN I: Troponin I: <0.03 ng/mL (ref <0.031)

## 2015-04-25 MED ORDER — CYCLOBENZAPRINE HCL 10 MG PO TABS: 5.0000 mg | ORAL_TABLET | Freq: Two times a day (BID) | ORAL | Status: DC | PRN

## 2015-04-25 MED ORDER — CYCLOBENZAPRINE HCL 10 MG PO TABS
5.0000 mg | ORAL_TABLET | Freq: Once | ORAL | Status: AC
  Administered 2015-04-25: 5 mg via ORAL
  Filled 2015-04-25: qty 1

## 2015-04-25 NOTE — Discharge Instructions (Signed)

## 2015-04-25 NOTE — ED Provider Notes (Signed)
CSN: [id number]     Arrival date & time 04/25/15  1308 History   First MD Initiated Contact with Patient 04/25/15 1624     Chief Complaint  Patient presents with  . Chest Pain     (Consider location/radiation/quality/duration/timing/severity/associated sxs/prior Treatment) HPI   Patient with significant PMH of leg fracture in January 2017 presents to the ER for evaluation of intermittent chest tightness since Saturday. 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 He states that that symptoms will last approx 2-3 minutes at a time and feel like muscle spasms and then he won't experience any symptoms for a few hours. He says that he does not have any symptoms at rest but notices when he is active his symptoms are more frequent. He denies having any associated N/V/D, weakness, CP, SOB, fatigue, diaphoresis, LE swelling, cough, back pain or fever .  Past Medical History  Diagnosis Date  . Leg fracture, left    History reviewed. No pertinent past surgical history. No family history on file. Social History  Substance Use Topics  . Smoking status: Current Every Day Smoker -- 0.30 packs/day    Types: Cigarettes  . Smokeless tobacco: None  . Alcohol Use: 10.8 oz/week    18 Cans of beer per week     Comment: 2-3 beers daily    Review of Systems  Review of Systems All other systems negative except as documented in the HPI. All pertinent positives and negatives as reviewed in the HPI.   Allergies  Review of patient's allergies indicates no known allergies.  Home Medications   Prior to Admission medications   Medication Sig Start Date End Date Taking? Authorizing Provider  diphenhydrAMINE (SOMINEX) 25 MG tablet Take 25 mg by mouth at bedtime as needed for allergies or sleep.   Yes Historical Provider, MD  Omega 3 1000 MG CAPS Take 2 capsules by mouth at bedtime.   Yes Historical Provider, MD  cyclobenzaprine (FLEXERIL) 10 MG tablet Take 0.5-1  tablets (5-10 mg total) by mouth 2 (two) times daily as needed. 04/25/15   Sana Tessmer Neva Seat, PA-C   BP 118/81 mmHg  Pulse 67  Temp(Src) 98.6 F (37 C) (Oral)  Resp 13  Ht  (1.905 m)  Wt 171.46 kg  BMI 47.25 kg/m2  SpO2 99% Physical Exam  Constitutional: He appears well-developed and well-nourished. No distress.  HENT:  Head: Normocephalic and atraumatic.  Right Ear: Tympanic membrane and ear canal normal.  Left Ear: Tympanic membrane and ear canal normal.  Nose: Nose normal.  Mouth/Throat: Uvula is midline, oropharynx is clear and moist and mucous membranes are normal.  Eyes: Pupils are equal, round, and reactive to light.  Neck: Normal range of motion. Neck supple.  Cardiovascular: Normal rate and regular rhythm.   Pulmonary/Chest: Effort normal and breath sounds normal. No accessory muscle usage. He exhibits no tenderness, no bony tenderness, no crepitus, no deformity, no swelling and no retraction.  Abdominal: Soft.  No signs of abdominal distention  Musculoskeletal:  No LE swelling  Neurological: He is alert.  Acting at baseline  Skin: Skin is warm and dry. No rash noted.  Nursing note and vitals reviewed.   ED Course  Procedures (including critical care time) Labs Review Labs Reviewed  BASIC METABOLIC PANEL - Abnormal; Notable for the following:    Glucose, Bld 100 (*)    All other components within normal limits  CBC  TROPONIN I  I-STAT TROPOININ, ED  Imaging Review Dg Chest 2 View  04/25/2015  CLINICAL DATA:  Chest tightness for 3 days.  History of smoking. EXAM: CHEST  2 VIEW COMPARISON:  03/20/2015; chest CT - 03/21/2015 FINDINGS: Grossly unchanged cardiac silhouette and mediastinal contours. No focal airspace opacities. No pleural effusion or pneumothorax. No evidence of edema. No acute osseus abnormalities. IMPRESSION: No acute cardiopulmonary disease. Electronically Signed   By: Simonne ComeJohn  Watts M.D.   On: 04/25/2015 13:53   I have personally reviewed and  evaluated these images and lab results as part of my medical decision-making.   EKG Interpretation None      MDM   Final diagnoses:  Pain, chest wall    Patient is to be discharged with recommendation to follow up with PCP in regards to today's hospital visit. Chest pain is not likely of cardiac or pulmonary etiology d/t presentation, neg work-up for DVT in March 2017 when he was having SOB, VSS, no tracheal deviation, no JVD or new murmur, RRR, breath sounds equal bilaterally, EKG without acute abnormalities, negative troponin, and negative CXR. Pt has been advised start a muscle relaxer and return to the ED is CP becomes exertional, associated with diaphoresis or nausea, radiates to left jaw/arm, worsens or becomes concerning in any way. Pt appears reliable for follow up and is agreeable to discharge.       Marlon Peliffany Ayce Pietrzyk, PA-C 04/25/15 2247  Marlon Peliffany Trula Frede, PA-C 04/25/15 2247  Lavera Guiseana Duo Liu, MD 04/26/15 [phone]

## 2015-04-25 NOTE — ED Notes (Signed)
Intermittent chest pressure x 3 days with SOB. Denies N/V.

## 2015-04-25 NOTE — ED Notes (Signed)
The pt returned to ER desk asking about wait time, had been moved OTF but states he never left, returned to track board will room the patient as soon as possible

## 2017-11-07 ENCOUNTER — Encounter (HOSPITAL_COMMUNITY): Payer: Self-pay

## 2017-11-07 ENCOUNTER — Emergency Department (HOSPITAL_COMMUNITY)
Admission: EM | Admit: 2017-11-07 | Discharge: 2017-11-07 | Disposition: A | Discharge status: Home / Self Care | Payer: Self-pay | Attending: Emergency Medicine | Admitting: Emergency Medicine

## 2017-11-07 ENCOUNTER — Other Ambulatory Visit: Payer: Self-pay

## 2017-11-07 DIAGNOSIS — X500XXA Overexertion from strenuous movement or load, initial encounter: Secondary | ICD-10-CM | POA: Insufficient documentation

## 2017-11-07 DIAGNOSIS — S46912A Strain of unspecified muscle, fascia and tendon at shoulder and upper arm level, left arm, initial encounter: Secondary | ICD-10-CM | POA: Insufficient documentation

## 2017-11-07 DIAGNOSIS — Y929 Unspecified place or not applicable: Secondary | ICD-10-CM | POA: Insufficient documentation

## 2017-11-07 DIAGNOSIS — Y9389 Activity, other specified: Secondary | ICD-10-CM | POA: Insufficient documentation

## 2017-11-07 DIAGNOSIS — I10 Essential (primary) hypertension: Secondary | ICD-10-CM | POA: Insufficient documentation

## 2017-11-07 DIAGNOSIS — Y999 Unspecified external cause status: Secondary | ICD-10-CM | POA: Insufficient documentation

## 2017-11-07 DIAGNOSIS — F17210 Nicotine dependence, cigarettes, uncomplicated: Secondary | ICD-10-CM | POA: Insufficient documentation

## 2017-11-07 MED ORDER — METHOCARBAMOL 500 MG PO TABS: 500.0000 mg | ORAL_TABLET | Freq: Three times a day (TID) | ORAL | 0 refills | Status: DC | PRN

## 2017-11-07 NOTE — ED Provider Notes (Signed)
[facility]-EMERGENCY DEPT Provider Note   CSN: [id number] Arrival date & time: 11/07/17  0815     History   Chief Complaint Chief Complaint  Patient presents with  . Muscle Pain    HPI Jose Mclaughlin is a 37 y.o. male.  HPI Patient presents with left shoulder pain.  States that this weekend he was moving full kegs of beer and while he was doing he felt an acute pull in his left anterior chest/shoulder.  Since then has had pain.  Worse with certain movements.  Worse if he raises arm up.  No numbness or weakness.  No difficulty breathing.  Has not had pains like this before. No history of hypertension but does have high blood pressure here.  Does not a primary care doctor.  No swelling in his legs.  Patient is obese. Past Medical History:  Diagnosis Date  . Leg fracture, left     There are no active problems to display for this patient.   History reviewed. No pertinent surgical history.      Home Medications    Prior to Admission medications   Medication Sig Start Date End Date Taking? Authorizing Provider  cyclobenzaprine (FLEXERIL) 10 MG tablet Take 0.5-1 tablets (5-10 mg total) by mouth 2 (two) times daily as needed. 04/25/15   Marlon Pel, PA-C  diphenhydrAMINE (SOMINEX) 25 MG tablet Take 25 mg by mouth at bedtime as needed for allergies or sleep.    [provider]  methocarbamol (ROBAXIN) 500 MG tablet Take 1 tablet (500 mg total) by mouth every 8 (eight) hours as needed for muscle spasms. 11/07/17   Benjiman Core, MD  Omega 3 1000 MG CAPS Take 2 capsules by mouth at bedtime.    [provider]    Family History No family history on file.  Social History Social History   Tobacco Use  . Smoking status: Current Every Day Smoker    Packs/day: 0.30    Types: Cigarettes  . Smokeless tobacco: Never Used  Substance Use Topics  . Alcohol use: Yes    Alcohol/week: 18.0 standard drinks    Types: 18 Cans of beer per week      Comment: 2-3 beers daily  . Drug use: No     Allergies   Patient has no known allergies.   Review of Systems Review of Systems  Constitutional: Negative for appetite change.  HENT: Negative for congestion.   Cardiovascular: Positive for chest pain.  Gastrointestinal: Negative for abdominal pain.  Genitourinary: Negative for frequency.  Musculoskeletal: Negative for back pain.       Left shoulder pain.  Skin: Negative for rash.  Neurological: Negative for weakness.  Psychiatric/Behavioral: Negative for confusion.     Physical Exam Updated Vital Signs BP (!) 181/99 (BP Location: Right Arm)   Pulse 91   Temp 98.1 F (36.7 C) (Oral)   Resp 16   Ht 6\' 3"  (1.905 m)   Wt (!) 167.8 kg   SpO2 98%   BMI 46.25 kg/m   Physical Exam  Constitutional: He appears well-developed.  HENT:  Head: Normocephalic.  Eyes: Pupils are equal, round, and reactive to light.  Neck: Neck supple.  Cardiovascular: Normal rate.  Pulmonary/Chest: Effort normal.  Abdominal: There is no tenderness.  Musculoskeletal: He exhibits tenderness.  Tenderness to left anterior left upper chest wall.  Some pain with movement of shoulder.  No tenderness over SI joint.  No skin changes.  No deformity.  Neurovascular intact in left  arm.  Neurological: He is alert.  Skin: Skin is warm.     ED Treatments / Results  Labs (all labs ordered are listed, but only abnormal results are displayed) Labs Reviewed - No data to display  EKG None  Radiology No results found.  Procedures Procedures (including critical care time)  Medications Ordered in ED Medications - No data to display   Initial Impression / Assessment and Plan / ED Course  I have reviewed the triage vital signs and the nursing notes.  Pertinent labs & imaging results that were available during my care of the patient were reviewed by me and considered in my medical decision making (see chart for details).     Patient with left  shoulder pain/chest pain.  Appears to be musculoskeletal since it started acutely after lifting something.  Has not had exertional chest pain.  Does have hypertension that needs to get followed.  Given resources for follow-up.  Does not have PCP or insurance.  Discharge home.  Given muscle relaxer .  Final Clinical Impressions(s) / ED Diagnoses   Final diagnoses:  Muscle strain, shoulder region, left, initial encounter  Hypertension, unspecified type    ED Discharge Orders         Ordered    methocarbamol (ROBAXIN) 500 MG tablet  Every 8 hours PRN     11/07/17 0843           Benjiman Core, MD 11/07/17 [phone]

## 2017-11-07 NOTE — ED Triage Notes (Signed)
Pt states that since the weekend, he has been having muscular pain in his chest. Pt states that the pain began when he was moving kegs. Pt states pain is worse with movement, and that hot showers help to relieve it.

## 2017-11-07 NOTE — Discharge Instructions (Signed)
Follow-up with primary care doctor for management of the blood pressure.  You may need to be started on medications and will need further evaluation.

## 2020-01-16 ENCOUNTER — Other Ambulatory Visit: Payer: Self-pay

## 2020-01-16 ENCOUNTER — Encounter (HOSPITAL_COMMUNITY): Payer: Self-pay | Admitting: Emergency Medicine

## 2020-01-16 ENCOUNTER — Ambulatory Visit (HOSPITAL_COMMUNITY)
Admission: EM | Admit: 2020-01-16 | Discharge: 2020-01-16 | Disposition: A | Discharge status: Home / Self Care | Payer: Self-pay | Attending: Emergency Medicine | Admitting: Emergency Medicine

## 2020-01-16 DIAGNOSIS — J029 Acute pharyngitis, unspecified: Secondary | ICD-10-CM

## 2020-01-16 DIAGNOSIS — K122 Cellulitis and abscess of mouth: Secondary | ICD-10-CM

## 2020-01-16 DIAGNOSIS — R030 Elevated blood-pressure reading, without diagnosis of hypertension: Secondary | ICD-10-CM

## 2020-01-16 MED ORDER — AMOXICILLIN 875 MG PO TABS: 875.0000 mg | ORAL_TABLET | Freq: Two times a day (BID) | ORAL | 0 refills | Status: AC

## 2020-01-16 NOTE — ED Triage Notes (Signed)
Pt presents with abscess in mouth and sore throat. States has an appt with dentist next week but concerned for infection.

## 2020-01-16 NOTE — Discharge Instructions (Addendum)
Take the amoxicillin as directed.  Follow up with your dentist as scheduled.   Your blood pressure is elevated today at 161/108.  Please have this rechecked by your primary care provider in 1-2 weeks.

## 2020-01-16 NOTE — ED Provider Notes (Signed)
[facility]    CSN: [id number] Arrival date & time: 01/16/20  1213      History   Chief Complaint Chief Complaint  Patient presents with  . Abscess  . Sore Throat    HPI Jose Mclaughlin is a 40 y.o. male.   Patient presents with a sore throat and abscess in his mouth.  He states the abscess ruptured and drained yesterday.  He has an appointment with his dentist on 01/19/2020.  He denies fever, chills, difficulty swallowing, shortness of breath, or other symptoms.  OTC treatment attempted at home.  The history is provided by the patient and medical records.    Past Medical History:  Diagnosis Date  . Leg fracture, left     There are no problems to display for this patient.   History reviewed. No pertinent surgical history.     Home Medications    Prior to Admission medications   Medication Sig Start Date End Date Taking? Authorizing Provider  amoxicillin (AMOXIL) 875 MG tablet Take 1 tablet (875 mg total) by mouth 2 (two) times daily for 7 days. 01/16/20 01/23/20 Yes Mickie Bail, NP  cyclobenzaprine (FLEXERIL) 10 MG tablet Take 0.5-1 tablets (5-10 mg total) by mouth 2 (two) times daily as needed. 04/25/15   Marlon Pel, PA-C  diphenhydrAMINE (SOMINEX) 25 MG tablet Take 25 mg by mouth at bedtime as needed for allergies or sleep.    [provider]  methocarbamol (ROBAXIN) 500 MG tablet Take 1 tablet (500 mg total) by mouth every 8 (eight) hours as needed for muscle spasms. 11/07/17   Benjiman Core, MD  Omega 3 1000 MG CAPS Take 2 capsules by mouth at bedtime.    [provider]    Family History History reviewed. No pertinent family history.  Social History Social History   Tobacco Use  . Smoking status: Current Every Day Smoker    Packs/day: 0.30    Types: Cigarettes  . Smokeless tobacco: Never Used  Substance Use Topics  . Alcohol use: Yes    Alcohol/week: 18.0 standard drinks    Types: 18 Cans of beer per week    Comment:  2-3 beers daily  . Drug use: No     Allergies   Patient has no known allergies.   Review of Systems Review of Systems  Constitutional: Negative for chills and fever.  HENT: Positive for dental problem and sore throat. Negative for ear pain.   Eyes: Negative for pain and visual disturbance.  Respiratory: Negative for cough and shortness of breath.   Cardiovascular: Negative for chest pain and palpitations.  Gastrointestinal: Negative for abdominal pain and vomiting.  Genitourinary: Negative for dysuria and hematuria.  Musculoskeletal: Negative for arthralgias and back pain.  Skin: Negative for color change and rash.  Neurological: Negative for seizures and syncope.  All other systems reviewed and are negative.    Physical Exam Triage Vital Signs ED Triage Vitals  Enc Vitals Group     BP 01/16/20 1324 (!) 161/108     Pulse Rate 01/16/20 1324 78     Resp 01/16/20 1324 18     Temp 01/16/20 1324 98 F (36.7 C)     Temp Source 01/16/20 1324 Oral     SpO2 01/16/20 1324 96 %     Weight --      Height --      Head Circumference --      Peak Flow --      Pain Score 01/16/20  1323 2     Pain Loc --      Pain Edu? --      Excl. in GC? --    No data found.  Updated Vital Signs BP (!) 161/108 (BP Location: Right Wrist)   Pulse 78   Temp 98 F (36.7 C) (Oral)   Resp 18   SpO2 96%   Visual Acuity Right Eye Distance:   Left Eye Distance:   Bilateral Distance:    Right Eye Near:   Left Eye Near:    Bilateral Near:     Physical Exam Vitals and nursing note reviewed.  Constitutional:      General: He is not in acute distress.    Appearance: He is well-developed and well-nourished. He is obese. He is not ill-appearing.  HENT:     Head: Normocephalic and atraumatic.     Mouth/Throat:     Mouth: Mucous membranes are moist.     Dentition: Gingival swelling, dental caries and dental abscesses present.     Pharynx: Oropharynx is clear. Uvula midline.      Comments:  Gingival swelling and erythema above front teeth.  Speech clear.  No difficulty swallowing.  Eyes:     Conjunctiva/sclera: Conjunctivae normal.  Cardiovascular:     Rate and Rhythm: Normal rate and regular rhythm.     Heart sounds: Normal heart sounds.  Pulmonary:     Effort: Pulmonary effort is normal. No respiratory distress.     Breath sounds: Normal breath sounds.  Abdominal:     Palpations: Abdomen is soft.     Tenderness: There is no abdominal tenderness.  Musculoskeletal:        General: No edema.     Cervical back: Neck supple.  Skin:    General: Skin is warm and dry.  Neurological:     General: No focal deficit present.     Mental Status: He is alert and oriented to person, place, and time.  Psychiatric:        Mood and Affect: Mood and affect and mood normal.        Behavior: Behavior normal.      UC Treatments / Results  Labs (all labs ordered are listed, but only abnormal results are displayed) Labs Reviewed - No data to display  EKG   Radiology No results found.  Procedures Procedures (including critical care time)  Medications Ordered in UC Medications - No data to display  Initial Impression / Assessment and Plan / UC Course  I have reviewed the triage vital signs and the nursing notes.  Pertinent labs & imaging results that were available during my care of the patient were reviewed by me and considered in my medical decision making (see chart for details).   Sore throat, oral abscess.  Elevated blood pressure reading.  Treating with amoxicillin.  Instructed patient to follow-up with his dentist on 01/19/2020 as scheduled.  Discussed with patient that his blood pressure is elevated today needs to be rechecked by his PCP in 1 to 2 weeks.  He agrees to plan of care.   Final Clinical Impressions(s) / UC Diagnoses   Final diagnoses:  Sore throat  Oral abscess  Elevated blood pressure reading     Discharge Instructions     Take the amoxicillin  as directed.  Follow up with your dentist as scheduled.   Your blood pressure is elevated today at 161/108.  Please have this rechecked by your primary care provider in 1-2 weeks.  ED Prescriptions    Medication Sig Dispense Auth. Provider   amoxicillin (AMOXIL) 875 MG tablet Take 1 tablet (875 mg total) by mouth 2 (two) times daily for 7 days. 14 tablet Mickie Bail, NP     PDMP not reviewed this encounter.   Mickie Bail, NP 01/16/20 1356

## 2020-02-09 ENCOUNTER — Encounter (HOSPITAL_COMMUNITY): Payer: Self-pay | Admitting: Emergency Medicine

## 2020-02-09 ENCOUNTER — Other Ambulatory Visit: Payer: Self-pay

## 2020-02-09 ENCOUNTER — Ambulatory Visit (HOSPITAL_COMMUNITY)
Admission: EM | Admit: 2020-02-09 | Discharge: 2020-02-09 | Disposition: A | Discharge status: Home / Self Care | Payer: 59 | Attending: Family Medicine | Admitting: Family Medicine

## 2020-02-09 DIAGNOSIS — L0501 Pilonidal cyst with abscess: Secondary | ICD-10-CM | POA: Diagnosis not present

## 2020-02-09 DIAGNOSIS — I10 Essential (primary) hypertension: Secondary | ICD-10-CM

## 2020-02-09 HISTORY — DX: Essential (primary) hypertension: I10

## 2020-02-09 MED ORDER — DOXYCYCLINE HYCLATE 100 MG PO CAPS: 100.0000 mg | ORAL_CAPSULE | Freq: Two times a day (BID) | ORAL | 0 refills | Status: DC

## 2020-02-09 NOTE — Discharge Instructions (Signed)
Your blood pressure was noted to be elevated during your visit today. If you are currently taking medication for high blood pressure, please ensure you are taking this as directed. If you do not have a history of high blood pressure and your blood pressure remains persistently elevated, you may need to begin taking a medication at some point. You may return here within the next few days to recheck if unable to see your primary care provider or if you do not have a one.  BP (!) 161/96 (BP Location: Right Arm) Comment (BP Location): large cuff, right forearm  Pulse 77   Temp 98.6 F (37 C) (Oral)   Resp (!) 21   SpO2 98%   BP Readings from Last 3 Encounters:  02/09/20 (!) 161/96  01/16/20 (!) 161/108  11/07/17 (!) 156/93

## 2020-02-09 NOTE — ED Triage Notes (Signed)
Patient reports abscess on buttocks.  History of the same.  Denies fever

## 2020-02-10 NOTE — ED Provider Notes (Signed)
[facility]   [id number] 02/09/20 Arrival Time: 1746  ASSESSMENT & PLAN:  1. Pilonidal abscess   2. Elevated blood pressure reading in office with diagnosis of hypertension     Incision and Drainage Procedure Note  Anesthesia: 2% plain lidocaine  Procedure Details  The procedure, risks and complications have been discussed in detail (including, but not limited to pain and bleeding) with the patient.  The skin induration was prepped and draped in the usual fashion. After adequate local anesthesia, I&D with a #11 blade was performed on the midline superior gluteal cleft with copious, bloody, purulent drainage.  EBL: minimal Drains: none Packing: none Condition: Tolerated procedure well Complications: none.  Begin: Meds ordered this encounter  Medications  . doxycycline (VIBRAMYCIN) 100 MG capsule    Sig: Take 1 capsule (100 mg total) by mouth 2 (two) times daily.    Dispense:  14 capsule    Refill:  0     Follow-up Information    Sur[facility], [facility].   Specialty: General Surgery Why: If worsening or failing to improve as anticipated. Contact information: [address] [place] [address] [phone]               Finish all antibiotics. OTC analgesics as needed.  Reviewed expectations re: course of current medical issues. Questions answered. Outlined signs and symptoms indicating need for more acute intervention. Patient verbalized understanding. After Visit Summary given.   SUBJECTIVE:  Jose Mclaughlin is a 40 y.o. male who presents with a possible infection of his upper midline buttock; h/o similar. Onset gradual, approximately 3-4 days ago without active drainage and without active bleeding. Symptoms have gradually worsened since beginning. Fever: absent. OTC/home treatment: none.   OBJECTIVE:  Vitals:   02/09/20 1901  BP: (!) 161/96  Pulse: 77  Resp: (!) 21  Temp: 98.6 F (37 C)  TempSrc: Oral  SpO2: 98%      General appearance: alert; no distress Upper buttock; midline: approx 2x3 cm poorly demarcated area of thickened skin with some center fluctuance; tender to touch; no active drainage or bleeding Psychological: alert and cooperative; normal mood and affect  No Known Allergies  Past Medical History:  Diagnosis Date  . Hypertension   . Leg fracture, left    Social History   Socioeconomic History  . Marital status: Single    Spouse name: Not on file  . Number of children: Not on file  . Years of education: Not on file  . Highest education level: Not on file  Occupational History  . Not on file  Tobacco Use  . Smoking status: Current Every Day Smoker    Packs/day: 0.30    Types: Cigarettes  . Smokeless tobacco: Never Used  Substance and Sexual Activity  . Alcohol use: Yes    Alcohol/week: 18.0 standard drinks    Types: 18 Cans of beer per week    Comment: 2-3 beers daily  . Drug use: No  . Sexual activity: Not on file  Other Topics Concern  . Not on file  Social History Narrative  . Not on file   Social Determinants of Health   Financial Resource Strain: Not on file  Food Insecurity: Not on file  Transportation Needs: Not on file  Physical Activity: Not on file  Stress: Not on file  Social Connections: Not on file   Family History  Adopted: Yes   History reviewed. No pertinent surgical history.  Mardella Layman, MD 02/10/20 [phone]

## 2020-03-29 ENCOUNTER — Other Ambulatory Visit: Payer: Self-pay

## 2020-03-29 ENCOUNTER — Ambulatory Visit (INDEPENDENT_AMBULATORY_CARE_PROVIDER_SITE_OTHER): Payer: 59

## 2020-03-29 ENCOUNTER — Encounter: Payer: Self-pay | Admitting: Internal Medicine

## 2020-03-29 ENCOUNTER — Ambulatory Visit (INDEPENDENT_AMBULATORY_CARE_PROVIDER_SITE_OTHER): Payer: 59 | Admitting: Internal Medicine

## 2020-03-29 DIAGNOSIS — Z720 Tobacco use: Secondary | ICD-10-CM | POA: Insufficient documentation

## 2020-03-29 DIAGNOSIS — I10 Essential (primary) hypertension: Secondary | ICD-10-CM | POA: Diagnosis not present

## 2020-03-29 DIAGNOSIS — R0683 Snoring: Secondary | ICD-10-CM

## 2020-03-29 DIAGNOSIS — Z0001 Encounter for general adult medical examination with abnormal findings: Secondary | ICD-10-CM

## 2020-03-29 DIAGNOSIS — J4521 Mild intermittent asthma with (acute) exacerbation: Secondary | ICD-10-CM

## 2020-03-29 DIAGNOSIS — R7303 Prediabetes: Secondary | ICD-10-CM

## 2020-03-29 DIAGNOSIS — J45909 Unspecified asthma, uncomplicated: Secondary | ICD-10-CM | POA: Insufficient documentation

## 2020-03-29 DIAGNOSIS — Z23 Encounter for immunization: Secondary | ICD-10-CM | POA: Diagnosis not present

## 2020-03-29 DIAGNOSIS — R058 Other specified cough: Secondary | ICD-10-CM

## 2020-03-29 DIAGNOSIS — Z6841 Body Mass Index (BMI) 40.0 and over, adult: Secondary | ICD-10-CM | POA: Diagnosis not present

## 2020-03-29 DIAGNOSIS — Z0000 Encounter for general adult medical examination without abnormal findings: Secondary | ICD-10-CM

## 2020-03-29 DIAGNOSIS — R3121 Asymptomatic microscopic hematuria: Secondary | ICD-10-CM | POA: Insufficient documentation

## 2020-03-29 DIAGNOSIS — E559 Vitamin D deficiency, unspecified: Secondary | ICD-10-CM

## 2020-03-29 LAB — CBC WITH DIFFERENTIAL/PLATELET
Basophils Absolute: 0 10*3/uL (ref 0.0–0.1)
Basophils Relative: 0.6 % (ref 0.0–3.0)
Eosinophils Absolute: 0.2 10*3/uL (ref 0.0–0.7)
Eosinophils Relative: 2.6 % (ref 0.0–5.0)
HCT: 45 % (ref 39.0–52.0)
Hemoglobin: 14.9 g/dL (ref 13.0–17.0)
Lymphocytes Relative: 26.9 % (ref 12.0–46.0)
Lymphs Abs: 1.7 10*3/uL (ref 0.7–4.0)
MCHC: 33 g/dL (ref 30.0–36.0)
MCV: 92.5 fl (ref 78.0–100.0)
Monocytes Absolute: 0.4 10*3/uL (ref 0.1–1.0)
Monocytes Relative: 7 % (ref 3.0–12.0)
Neutro Abs: 3.9 10*3/uL (ref 1.4–7.7)
Neutrophils Relative %: 62.9 % (ref 43.0–77.0)
Platelets: 185 10*3/uL (ref 150.0–400.0)
RBC: 4.87 Mil/uL (ref 4.22–5.81)
RDW: 14.6 % (ref 11.5–15.5)
WBC: 6.2 10*3/uL (ref 4.0–10.5)

## 2020-03-29 LAB — URINALYSIS, ROUTINE W REFLEX MICROSCOPIC
Bilirubin Urine: NEGATIVE
Ketones, ur: NEGATIVE
Leukocytes,Ua: NEGATIVE
Nitrite: NEGATIVE
Specific Gravity, Urine: 1.02 (ref 1.000–1.030)
Total Protein, Urine: NEGATIVE
Urine Glucose: NEGATIVE
Urobilinogen, UA: 1 (ref 0.0–1.0)
pH: 7 (ref 5.0–8.0)

## 2020-03-29 LAB — LIPID PANEL
Cholesterol: 132 mg/dL (ref 0–200)
HDL: 43.5 mg/dL (ref >39.00)
LDL Cholesterol: 78 mg/dL (ref 0–99)
NonHDL: 88.53
Total CHOL/HDL Ratio: 3
Triglycerides: 52 mg/dL (ref 0.0–149.0)
VLDL: 10.4 mg/dL (ref 0.0–40.0)

## 2020-03-29 LAB — BASIC METABOLIC PANEL
BUN: 10 mg/dL (ref 6–23)
CO2: 33 mEq/L — ABNORMAL HIGH (ref 19–32)
Calcium: 9.1 mg/dL (ref 8.4–10.5)
Chloride: 102 mEq/L (ref 96–112)
Creatinine, Ser: 0.75 mg/dL (ref 0.40–1.50)
GFR: 114.61 mL/min (ref >60.00)
Glucose, Bld: 125 mg/dL — ABNORMAL HIGH (ref 70–99)
Potassium: 3.8 mEq/L (ref 3.5–5.1)
Sodium: 141 mEq/L (ref 135–145)

## 2020-03-29 LAB — HEMOGLOBIN A1C: Hgb A1c MFr Bld: 6.2 % (ref 4.6–6.5)

## 2020-03-29 LAB — HEPATIC FUNCTION PANEL
ALT: 23 U/L (ref 0–53)
AST: 17 U/L (ref 0–37)
Albumin: 4 g/dL (ref 3.5–5.2)
Alkaline Phosphatase: 77 U/L (ref 39–117)
Bilirubin, Direct: 0.1 mg/dL (ref 0.0–0.3)
Total Bilirubin: 0.5 mg/dL (ref 0.2–1.2)
Total Protein: 7.7 g/dL (ref 6.0–8.3)

## 2020-03-29 LAB — VITAMIN D 25 HYDROXY (VIT D DEFICIENCY, FRACTURES): VITD: 9.95 ng/mL — ABNORMAL LOW (ref 30.00–100.00)

## 2020-03-29 LAB — TSH: TSH: 1.45 u[IU]/mL (ref 0.35–4.50)

## 2020-03-29 MED ORDER — ALBUTEROL SULFATE HFA 108 (90 BASE) MCG/ACT IN AERS: 2.0000 | INHALATION_SPRAY | Freq: Four times a day (QID) | RESPIRATORY_TRACT | 2 refills | Status: DC | PRN

## 2020-03-29 MED ORDER — METHYLPREDNISOLONE 4 MG PO TBPK: ORAL_TABLET | ORAL | 0 refills | Status: AC

## 2020-03-29 MED ORDER — OLMESARTAN MEDOXOMIL 40 MG PO TABS: 40.0000 mg | ORAL_TABLET | Freq: Every day | ORAL | 1 refills | Status: DC

## 2020-03-29 MED ORDER — CHOLECALCIFEROL 1.25 MG (50000 UT) PO CAPS: 50000.0000 [IU] | ORAL_CAPSULE | ORAL | 1 refills | Status: DC

## 2020-03-29 NOTE — Progress Notes (Signed)
Subjective:  Patient ID: Jose Mclaughlin, male    DOB: 12-29-1980  Age: 40 y.o. MRN: [id number]  CC: Annual Exam, Hypertension, and Cough  This visit occurred during the SARS-CoV-2 public health emergency.  Safety protocols were in place, including screening questions prior to the visit, additional usage of staff PPE, and extensive cleaning of exam room while observing appropriate contact time as indicated for disinfecting solutions.    HPI Jose Mclaughlin presents for a CPX and to establish.  He complains of a 5 day hx of cough that his productive of white phlegm with DOE, SOB, and wheezing.  History Jose Mclaughlin has a past medical history of Hypertension and Leg fracture, left.   He has no past surgical history on file.   His He was adopted. Family history is unknown by patient.He reports that he has been smoking cigarettes. He started smoking about 23 years ago. He has a 11.00 pack-year smoking history. He has never used smokeless tobacco. He reports current alcohol use of about 18.0 standard drinks of alcohol per week. He reports that he does not use drugs.  Outpatient Medications Prior to Visit  Medication Sig Dispense Refill  . doxycycline (VIBRAMYCIN) 100 MG capsule Take 1 capsule (100 mg total) by mouth 2 (two) times daily. 14 capsule 0  . Omega 3 1000 MG CAPS Take 2 capsules by mouth at bedtime.     No facility-administered medications prior to visit.    ROS Review of Systems  Constitutional: Negative for appetite change, chills, diaphoresis, fatigue and fever.  HENT: Negative.  Negative for sore throat and trouble swallowing.   Respiratory: Positive for cough, shortness of breath and wheezing.   Cardiovascular: Positive for leg swelling. Negative for chest pain and palpitations.  Gastrointestinal: Negative for abdominal pain, constipation, diarrhea, nausea and vomiting.  Genitourinary: Negative for difficulty urinating, scrotal swelling and testicular pain.  Musculoskeletal:  Negative.  Negative for arthralgias and myalgias.  Skin: Negative.   Neurological: Negative for dizziness, weakness and light-headedness.  Hematological: Negative for adenopathy. Does not bruise/bleed easily.  Psychiatric/Behavioral: Negative.     Objective:  BP (!) 144/84 (BP Location: Right Arm, Patient Position: Sitting) Comment: thigh cuff  Pulse 82   Temp 98.1 F (36.7 C) (Oral)   Resp 16   Ht 6\' 3"  (1.905 m)   Wt (!) 490 lb (222.3 kg)   SpO2 91%   BMI 61.25 kg/m   Physical Exam Vitals reviewed.  Constitutional:      General: He is not in acute distress.    Appearance: He is obese. He is not toxic-appearing or diaphoretic.  HENT:     Nose: Nose normal.     Mouth/Throat:     Mouth: Mucous membranes are moist.  Eyes:     General: No scleral icterus.    Conjunctiva/sclera: Conjunctivae normal.  Cardiovascular:     Rate and Rhythm: Normal rate and regular rhythm.     Heart sounds: No murmur heard.     Comments: EKG- NSR, 76 bpm LAFB, rightward axis No LVH or Q waves No old EKG's for comparison Pulmonary:     Effort: Pulmonary effort is normal. No tachypnea, accessory muscle usage or respiratory distress.     Breath sounds: Normal air entry. Examination of the right-upper field reveals decreased breath sounds. Examination of the left-upper field reveals decreased breath sounds. Examination of the right-middle field reveals decreased breath sounds and wheezing. Examination of the left-middle field reveals decreased breath sounds. Examination of the right-lower field  reveals decreased breath sounds and rhonchi. Examination of the left-lower field reveals decreased breath sounds and rhonchi. Decreased breath sounds, wheezing and rhonchi present. No rales.  Abdominal:     General: Abdomen is protuberant. Bowel sounds are normal. There is no distension.     Palpations: Abdomen is soft. There is no hepatomegaly, splenomegaly or mass.     Tenderness: There is no abdominal  tenderness.  Musculoskeletal:        General: Normal range of motion.     Cervical back: Neck supple.     Right lower leg: 3+ Edema present.     Left lower leg: 3+ Edema present.     Comments: +elephantiasis BLE  Lymphadenopathy:     Cervical: No cervical adenopathy.  Skin:    General: Skin is warm and dry.     Coloration: Skin is not jaundiced or pale.     Findings: No rash.  Neurological:     General: No focal deficit present.     Mental Status: He is alert.  Psychiatric:        Mood and Affect: Mood normal.        Behavior: Behavior normal.     Lab Results  Component Value Date   WBC 6.2 03/29/2020   HGB 14.9 03/29/2020   HCT 45.0 03/29/2020   PLT 185.0 03/29/2020   GLUCOSE 125 (H) 03/29/2020   CHOL 132 03/29/2020   TRIG 52.0 03/29/2020   HDL 43.50 03/29/2020   LDLCALC 78 03/29/2020   ALT 23 03/29/2020   AST 17 03/29/2020   NA 141 03/29/2020   K 3.8 03/29/2020   CL 102 03/29/2020   CREATININE 0.75 03/29/2020   BUN 10 03/29/2020   CO2 33 (H) 03/29/2020   TSH 1.45 03/29/2020   HGBA1C 6.2 03/29/2020   DG Chest 2 View  Result Date: 03/29/2020 CLINICAL DATA:  Cough EXAM: CHEST - 2 VIEW COMPARISON:  April 25, 2015 FINDINGS: Lungs are clear. Heart size and pulmonary vascularity are normal. No adenopathy. No bone lesions. IMPRESSION: Lungs clear.  Heart size normal. Electronically Signed   By: Bretta Bang III M.D.   On: 03/29/2020 10:07    Assessment & Plan:   Jose Mclaughlin was seen today for annual exam, hypertension and cough.  Diagnoses and all orders for this visit:  Morbid obesity with BMI of 60.0-69.9, adult (HCC)- He is prediabetic but the rest of his labs are negative for secondary causes or complications. -     CBC with Differential/Platelet; Future -     Basic metabolic panel; Future -     TSH; Future -     Hemoglobin A1c; Future -     Hepatic function panel; Future -     Hepatic function panel -     Hemoglobin A1c -     TSH -     Basic metabolic  panel -     CBC with Differential/Platelet  Snoring- Based on his symptoms, physical examination, and EKG I am concerned he may have sleep apnea. -     Ambulatory referral to Sleep Studies  Primary hypertension- He has stage I hypertension.  I will check labs to screen for secondary causes and endorgan damage.  I recommended that he start taking an ARB. -     EKG 12-Lead -     CBC with Differential/Platelet; Future -     Basic metabolic panel; Future -     TSH; Future -     Hepatic function panel;  Future -     VITAMIN D 25 Hydroxy (Vit-D Deficiency, Fractures); Future -     Urinalysis, Routine w reflex microscopic; Future -     Aldosterone + renin activity w/ ratio; Future -     Aldosterone + renin activity w/ ratio -     Urinalysis, Routine w reflex microscopic -     VITAMIN D 25 Hydroxy (Vit-D Deficiency, Fractures) -     Hepatic function panel -     TSH -     Basic metabolic panel -     CBC with Differential/Platelet -     olmesartan (BENICAR) 40 MG tablet; Take 1 tablet (40 mg total) by mouth daily.  Encounter for general adult medical examination with abnormal findings- Exam completed, labs reviewed, vaccines reviewed and updated, patient education material was given. -     Lipid panel; Future -     Hepatitis C antibody; Future -     HIV Antibody (routine testing w rflx); Future -     HIV Antibody (routine testing w rflx) -     Hepatitis C antibody -     Lipid panel  Productive cough- His chest x-ray is negative for mass or infiltrate. -     DG Chest 2 View; Future  Tobacco abuse disorder- I encouraged him to quit smoking.  Mild intermittent asthmatic bronchitis with acute exacerbation- I will treat him with a course of methylprednisolone and a SABA. -     albuterol (VENTOLIN HFA) 108 (90 Base) MCG/ACT inhaler; Inhale 2 puffs into the lungs every 6 (six) hours as needed for wheezing or shortness of breath. -     methylPREDNISolone (MEDROL DOSEPAK) 4 MG TBPK tablet; TAKE AS  DIRECTED  Vitamin D deficiency disease -     Cholecalciferol 1.25 MG (50000 UT) capsule; Take 1 capsule (50,000 Units total) by mouth once a week.  Asymptomatic microscopic hematuria- I have asked him to undergo a CT (renal protocol) to see if there is a mass, cyst, stone, or other lesion that would explain the hematuria. -     CT RENAL STONE STUDY; Future  Prediabetes- Will consider treatment options upon his return.  Other orders -     Tdap vaccine greater than or equal to 7yo IM   I have discontinued Kylon Butterbaugh's Omega 3 and doxycycline. I am also having him start on albuterol, methylPREDNISolone, Cholecalciferol, and olmesartan.  Meds ordered this encounter  Medications  . albuterol (VENTOLIN HFA) 108 (90 Base) MCG/ACT inhaler    Sig: Inhale 2 puffs into the lungs every 6 (six) hours as needed for wheezing or shortness of breath.    Dispense:  8 g    Refill:  2  . methylPREDNISolone (MEDROL DOSEPAK) 4 MG TBPK tablet    Sig: TAKE AS DIRECTED    Dispense:  21 tablet    Refill:  0  . Cholecalciferol 1.25 MG (50000 UT) capsule    Sig: Take 1 capsule (50,000 Units total) by mouth once a week.    Dispense:  12 capsule    Refill:  1  . olmesartan (BENICAR) 40 MG tablet    Sig: Take 1 tablet (40 mg total) by mouth daily.    Dispense:  90 tablet    Refill:  1       Follow-up: Return in about 3 months (around 06/29/2020).  Sanda Linger, MD

## 2020-03-29 NOTE — Patient Instructions (Signed)

## 2020-04-01 ENCOUNTER — Encounter: Payer: Self-pay | Admitting: Internal Medicine

## 2020-04-04 LAB — HIV ANTIBODY (ROUTINE TESTING W REFLEX): HIV 1&2 Ab, 4th Generation: NONREACTIVE

## 2020-04-04 LAB — HEPATITIS C ANTIBODY
Hepatitis C Ab: NONREACTIVE
SIGNAL TO CUT-OFF: 0.04 (ref <1.00)

## 2020-04-04 LAB — ALDOSTERONE + RENIN ACTIVITY W/ RATIO
ALDO / PRA Ratio: 8.3 Ratio (ref 0.9–28.9)
Aldosterone: 10 ng/dL
Renin Activity: 1.21 ng/mL/h (ref 0.25–5.82)

## 2020-04-11 ENCOUNTER — Encounter: Payer: Self-pay | Admitting: Internal Medicine

## 2020-10-04 ENCOUNTER — Other Ambulatory Visit: Payer: Self-pay | Admitting: Internal Medicine

## 2020-10-04 DIAGNOSIS — I10 Essential (primary) hypertension: Secondary | ICD-10-CM

## 2020-11-28 ENCOUNTER — Encounter: Payer: Self-pay | Admitting: Internal Medicine

## 2020-12-06 ENCOUNTER — Ambulatory Visit (INDEPENDENT_AMBULATORY_CARE_PROVIDER_SITE_OTHER): Payer: 59 | Admitting: Internal Medicine

## 2020-12-06 ENCOUNTER — Other Ambulatory Visit: Payer: Self-pay

## 2020-12-06 ENCOUNTER — Encounter: Payer: Self-pay | Admitting: Internal Medicine

## 2020-12-06 VITALS — BP 132/86 | HR 85 | Temp 98.2°F | Resp 16 | Ht 75.0 in | Wt >= 6400 oz

## 2020-12-06 DIAGNOSIS — I10 Essential (primary) hypertension: Secondary | ICD-10-CM | POA: Diagnosis not present

## 2020-12-06 DIAGNOSIS — R0683 Snoring: Secondary | ICD-10-CM | POA: Diagnosis not present

## 2020-12-06 DIAGNOSIS — R3121 Asymptomatic microscopic hematuria: Secondary | ICD-10-CM | POA: Diagnosis not present

## 2020-12-06 DIAGNOSIS — Z23 Encounter for immunization: Secondary | ICD-10-CM

## 2020-12-06 DIAGNOSIS — R7303 Prediabetes: Secondary | ICD-10-CM | POA: Diagnosis not present

## 2020-12-06 LAB — URINALYSIS, ROUTINE W REFLEX MICROSCOPIC
Bilirubin Urine: NEGATIVE
Hgb urine dipstick: NEGATIVE
Ketones, ur: NEGATIVE
Leukocytes,Ua: NEGATIVE
Nitrite: NEGATIVE
RBC / HPF: NONE SEEN (ref 0–?)
Specific Gravity, Urine: 1.02 (ref 1.000–1.030)
Total Protein, Urine: NEGATIVE
Urine Glucose: NEGATIVE
Urobilinogen, UA: 1 (ref 0.0–1.0)
pH: 6.5 (ref 5.0–8.0)

## 2020-12-06 LAB — BASIC METABOLIC PANEL
BUN: 15 mg/dL (ref 6–23)
CO2: 30 mEq/L (ref 19–32)
Calcium: 8.6 mg/dL (ref 8.4–10.5)
Chloride: 102 mEq/L (ref 96–112)
Creatinine, Ser: 0.77 mg/dL (ref 0.40–1.50)
GFR: 113.15 mL/min (ref >60.00)
Glucose, Bld: 103 mg/dL — ABNORMAL HIGH (ref 70–99)
Potassium: 4.1 mEq/L (ref 3.5–5.1)
Sodium: 137 mEq/L (ref 135–145)

## 2020-12-06 LAB — HEMOGLOBIN A1C: Hgb A1c MFr Bld: 6.2 % (ref 4.6–6.5)

## 2020-12-06 MED ORDER — OLMESARTAN MEDOXOMIL 40 MG PO TABS: 40.0000 mg | ORAL_TABLET | Freq: Every day | ORAL | 1 refills | Status: DC

## 2020-12-06 NOTE — Progress Notes (Signed)
Subjective:  Patient ID: Jose Mclaughlin, male    DOB: 1980-01-11  Age: 40 y.o. MRN: [id number]  CC: Hypertension  This visit occurred during the SARS-CoV-2 public health emergency.  Safety protocols were in place, including screening questions prior to the visit, additional usage of staff PPE, and extensive cleaning of exam room while observing appropriate contact time as indicated for disinfecting solutions.    HPI [name] presents for f/up -  When I last saw him there was significant hematuria.  A renal CT was ordered but was never done.  He denies any episodes of abdominal pain, flank pain, or hematuria.  He thinks his blood pressure is well controlled.  He denies headache, blurred vision, chest pain, shortness of breath, dizziness or lightheadedness. He has a history of snoring.  Outpatient Medications Prior to Visit  Medication Sig Dispense Refill   albuterol (VENTOLIN HFA) 108 (90 Base) MCG/ACT inhaler Inhale 2 puffs into the lungs every 6 (six) hours as needed for wheezing or shortness of breath. 8 g 2   Cholecalciferol 1.25 MG (50000 UT) capsule Take 1 capsule (50,000 Units total) by mouth once a week. 12 capsule 1   olmesartan (BENICAR) 40 MG tablet Take 1 tablet (40 mg total) by mouth daily. 90 tablet 1   No facility-administered medications prior to visit.    ROS Review of Systems  Constitutional:  Positive for unexpected weight change (wt gain). Negative for diaphoresis and fatigue.  Respiratory:  Negative for apnea, cough, shortness of breath and wheezing.   Cardiovascular:  Negative for chest pain, palpitations and leg swelling.  Gastrointestinal:  Negative for abdominal pain, constipation, diarrhea, nausea and vomiting.  Genitourinary:  Negative for difficulty urinating.  Musculoskeletal:  Negative for arthralgias and myalgias.  Skin: Negative.   Neurological: Negative.  Negative for dizziness, weakness and light-headedness.  Hematological:  Negative for  adenopathy. Does not bruise/bleed easily.  Psychiatric/Behavioral: Negative.     Objective:  BP 132/86 (BP Location: Left Arm, Patient Position: Sitting, Cuff Size: Large)   Pulse 85   Temp 98.2 F (36.8 C) (Oral)   Resp 16   Ht 6\' 3"  (1.905 m)   Wt (!) 524 lb (237.7 kg)   SpO2 93%   BMI 65.50 kg/m   BP Readings from Last 3 Encounters:  12/06/20 132/86  03/29/20 (!) 144/84  02/09/20 (!) 161/96    Wt Readings from Last 3 Encounters:  12/06/20 (!) 524 lb (237.7 kg)  03/29/20 (!) 490 lb (222.3 kg)  11/07/17 (!) 370 lb (167.8 kg)    Physical Exam Constitutional:      Appearance: He is obese.  HENT:     Nose: Nose normal.     Mouth/Throat:     Mouth: Mucous membranes are moist.  Eyes:     General: No scleral icterus.    Conjunctiva/sclera: Conjunctivae normal.  Cardiovascular:     Rate and Rhythm: Normal rate and regular rhythm.     Heart sounds: No murmur heard. Pulmonary:     Effort: Pulmonary effort is normal.     Breath sounds: No stridor. No wheezing, rhonchi or rales.  Abdominal:     General: Abdomen is protuberant. Bowel sounds are normal. There is no distension.     Palpations: Abdomen is soft. There is no hepatomegaly, splenomegaly or mass.     Tenderness: There is no abdominal tenderness.  Musculoskeletal:        General: No swelling.     Cervical back: Neck supple.  Right lower leg: No edema.     Left lower leg: No edema.     Comments: ++elephantiasis but not edema   Lymphadenopathy:     Cervical: No cervical adenopathy.  Skin:    General: Skin is warm and dry.     Coloration: Skin is not pale.  Neurological:     General: No focal deficit present.     Mental Status: He is alert. Mental status is at baseline.  Psychiatric:        Mood and Affect: Mood normal.        Behavior: Behavior normal.    Lab Results  Component Value Date   WBC 6.2 03/29/2020   HGB 14.9 03/29/2020   HCT 45.0 03/29/2020   PLT 185.0 03/29/2020   GLUCOSE 103 (H)  12/06/2020   CHOL 132 03/29/2020   TRIG 52.0 03/29/2020   HDL 43.50 03/29/2020   LDLCALC 78 03/29/2020   ALT 23 03/29/2020   AST 17 03/29/2020   NA 137 12/06/2020   K 4.1 12/06/2020   CL 102 12/06/2020   CREATININE 0.77 12/06/2020   BUN 15 12/06/2020   CO2 30 12/06/2020   TSH 1.45 03/29/2020   HGBA1C 6.2 12/06/2020    No results found.  Assessment & Plan:   Juandaniel was seen today for hypertension.  Diagnoses and all orders for this visit:  Prediabetes- His A1c is at 6.2%.  Medical treatment is not yet indicated.  I have encouraged him to keep working on his lifestyle modifications. -     Basic metabolic panel; Future -     Hemoglobin A1c; Future -     Hemoglobin A1c -     Basic metabolic panel  Primary hypertension- His blood pressure is adequately well controlled. -     olmesartan (BENICAR) 40 MG tablet; Take 1 tablet (40 mg total) by mouth daily. -     Basic metabolic panel; Future -     Urinalysis, Routine w reflex microscopic; Future -     Urinalysis, Routine w reflex microscopic -     Basic metabolic panel  Asymptomatic microscopic hematuria- There is no longer hematuria. -     Urinalysis, Routine w reflex microscopic; Future -     Urinalysis, Routine w reflex microscopic  Snoring- I recommend that he consider undergoing a sleep study. -     Ambulatory referral to Sleep Studies  Other orders -     Flu Vaccine QUAD 6+ mos PF IM (Fluarix Quad PF)  I am having Estanislado Pandy maintain his albuterol, Cholecalciferol, and olmesartan.  Meds ordered this encounter  Medications   olmesartan (BENICAR) 40 MG tablet    Sig: Take 1 tablet (40 mg total) by mouth daily.    Dispense:  90 tablet    Refill:  1     Follow-up: Return in about 6 months (around 06/06/2021).  Sanda Linger, MD

## 2020-12-06 NOTE — Patient Instructions (Signed)

## 2020-12-08 ENCOUNTER — Encounter: Payer: Self-pay | Admitting: Internal Medicine

## 2020-12-15 ENCOUNTER — Encounter (HOSPITAL_COMMUNITY): Payer: Self-pay | Admitting: Emergency Medicine

## 2020-12-15 ENCOUNTER — Other Ambulatory Visit: Payer: Self-pay

## 2020-12-15 ENCOUNTER — Ambulatory Visit (HOSPITAL_COMMUNITY)
Admission: EM | Admit: 2020-12-15 | Discharge: 2020-12-15 | Disposition: A | Discharge status: Home / Self Care | Payer: 59 | Attending: Internal Medicine | Admitting: Internal Medicine

## 2020-12-15 DIAGNOSIS — B302 Viral pharyngoconjunctivitis: Secondary | ICD-10-CM | POA: Insufficient documentation

## 2020-12-15 DIAGNOSIS — Z20822 Contact with and (suspected) exposure to covid-19: Secondary | ICD-10-CM | POA: Insufficient documentation

## 2020-12-15 LAB — SARS CORONAVIRUS 2 (TAT 6-24 HRS): SARS Coronavirus 2: NEGATIVE

## 2020-12-15 MED ORDER — LIDOCAINE VISCOUS HCL 2 % MT SOLN: 15.0000 mL | Freq: Four times a day (QID) | OROMUCOSAL | 0 refills | Status: DC | PRN

## 2020-12-15 MED ORDER — BENZONATATE 100 MG PO CAPS: 100.0000 mg | ORAL_CAPSULE | Freq: Three times a day (TID) | ORAL | 0 refills | Status: DC | PRN

## 2020-12-15 MED ORDER — LIDOCAINE HCL (PF) 1 % IJ SOLN
INTRAMUSCULAR | Status: AC
  Filled 2020-12-15: qty 30

## 2020-12-15 MED ORDER — ERYTHROMYCIN 5 MG/GM OP OINT: TOPICAL_OINTMENT | Freq: Every day | OPHTHALMIC | 0 refills | Status: AC

## 2020-12-15 NOTE — ED Triage Notes (Signed)
Pt is present today with a sore throat and right eye drainage and irritation. Pt sore throat started Saturday night and eye irritation started yesterday

## 2020-12-15 NOTE — Discharge Instructions (Addendum)
Maintain adequate hydration Take medications as prescribed We will call you with recommendations if lab results are abnormal Return to urgent care if symptoms worsen.

## 2020-12-15 NOTE — ED Provider Notes (Signed)
[facility]    CSN: [id number] Arrival date & time: 12/15/20  1093      History   Chief Complaint Chief Complaint  Patient presents with   Sore Throat   Conjunctivitis    HPI Cassie Henkels is a 40 y.o. male comes to the urgent care with 5-day history of sore throat, nonproductive cough and 1 day history of right eye redness with purulent drainage.  Patient's symptoms started 5 days ago and has been persistent.  Throat pain is currently severe.  It is associated with swallowing or drinking fluids.  No known relieving factors.  Patient denies any fever or chills.  No body aches.  No nausea vomiting or diarrhea.  Patient is vaccinated against COVID.  No sick contacts.  Right eye redness is associated with purulent drainage.  Patient denies any light sensitivity.   HPI  Past Medical History:  Diagnosis Date   Hypertension    Leg fracture, left     Patient Active Problem List   Diagnosis Date Noted   Morbid obesity with BMI of 60.0-69.9, adult (HCC) 03/29/2020   Snoring 03/29/2020   Encounter for general adult medical examination with abnormal findings 03/29/2020   Primary hypertension 03/29/2020   Tobacco abuse disorder 03/29/2020   Vitamin D deficiency disease 03/29/2020   Asymptomatic microscopic hematuria 03/29/2020   Prediabetes 03/29/2020    History reviewed. No pertinent surgical history.     Home Medications    Prior to Admission medications   Medication Sig Start Date End Date Taking? Authorizing Provider  benzonatate (TESSALON) 100 MG capsule Take 1 capsule (100 mg total) by mouth 3 (three) times daily as needed for cough. 12/15/20  Yes Phiona Ramnauth, Britta Mccreedy, MD  erythromycin ophthalmic ointment Place into the right eye at bedtime for 5 days. Place a 1/2 inch ribbon of ointment into the lower eyelid. 12/15/20 12/20/20 Yes Daymian Lill, Britta Mccreedy, MD  lidocaine (XYLOCAINE) 2 % solution Use as directed 15 mLs in the mouth or throat every 6 (six) hours as needed  for mouth pain. 12/15/20  Yes Reggie Bise, Britta Mccreedy, MD  albuterol (VENTOLIN HFA) 108 (90 Base) MCG/ACT inhaler Inhale 2 puffs into the lungs every 6 (six) hours as needed for wheezing or shortness of breath. 03/29/20   Etta Grandchild, MD  Cholecalciferol 1.25 MG (50000 UT) capsule Take 1 capsule (50,000 Units total) by mouth once a week. 03/29/20   Etta Grandchild, MD  olmesartan (BENICAR) 40 MG tablet Take 1 tablet (40 mg total) by mouth daily. 12/06/20   Etta Grandchild, MD  diphenhydrAMINE (SOMINEX) 25 MG tablet Take 25 mg by mouth at bedtime as needed for allergies or sleep.  02/09/20  [provider]    Family History Family History  Adopted: Yes    Social History Social History   Tobacco Use   Smoking status: Every Day    Packs/day: 0.50    Years: 22.00    Pack years: 11.00    Types: Cigarettes    Start date: 03/29/1997   Smokeless tobacco: Never  Substance Use Topics   Alcohol use: Yes    Alcohol/week: 18.0 standard drinks    Types: 18 Cans of beer per week    Comment: 2-3 beers daily   Drug use: No     Allergies   Patient has no known allergies.   Review of Systems Review of Systems  Constitutional: Negative.   HENT: Negative.    Eyes:  Positive for discharge and redness.  Negative for photophobia, pain, itching and visual disturbance.  Respiratory:  Positive for cough. Negative for shortness of breath and wheezing.   Gastrointestinal: Negative.   Musculoskeletal: Negative.  Negative for myalgias.    Physical Exam Triage Vital Signs ED Triage Vitals  Enc Vitals Group     BP 12/15/20 0946 (!) 166/93     Pulse Rate 12/15/20 0946 88     Resp 12/15/20 0946 17     Temp 12/15/20 0946 98.4 F (36.9 C)     Temp Source 12/15/20 0946 Oral     SpO2 12/15/20 0946 97 %     Weight --      Height --      Head Circumference --      Peak Flow --      Pain Score 12/15/20 0945 9     Pain Loc --      Pain Edu? --      Excl. in [place]? --    No data found.  Updated  Vital Signs BP (!) 166/93 (BP Location: Left Arm)    Pulse 88    Temp 98.4 F (36.9 C) (Oral)    Resp 17    SpO2 97%   Visual Acuity Right Eye Distance:   Left Eye Distance:   Bilateral Distance:    Right Eye Near:   Left Eye Near:    Bilateral Near:     Physical Exam Vitals and nursing note reviewed.  Constitutional:      General: He is not in acute distress.    Appearance: He is not ill-appearing.  HENT:     Right Ear: Tympanic membrane normal.     Left Ear: Tympanic membrane normal.     Nose: No rhinorrhea.     Mouth/Throat:     Mouth: Mucous membranes are moist. Mucous membranes are pale.     Pharynx: No posterior oropharyngeal erythema.  Eyes:     Comments: Conjunctival erythema in the right eye.  Purulent drainage noted.  Cardiovascular:     Rate and Rhythm: Normal rate and regular rhythm.  Pulmonary:     Effort: Pulmonary effort is normal.     Breath sounds: Normal breath sounds.  Neurological:     Mental Status: He is alert.     UC Treatments / Results  Labs (all labs ordered are listed, but only abnormal results are displayed) Labs Reviewed  SARS CORONAVIRUS 2 (TAT 6-24 HRS)    EKG   Radiology No results found.  Procedures Procedures (including critical care time)  Medications Ordered in UC Medications - No data to display  Initial Impression / Assessment and Plan / UC Course  I have reviewed the triage vital signs and the nursing notes.  Pertinent labs & imaging results that were available during my care of the patient were reviewed by me and considered in my medical decision making (see chart for details).     1.  Viral fungal conjunctivitis: Erythromycin eye ointment twice daily for 5 days Increase oral fluid intake Tessalon Perles as needed for cough Lidocaine solution as needed for throat pain COVID-19 PCR test has been sent Return to urgent care if symptoms worsen. Final Clinical Impressions(s) / UC Diagnoses   Final diagnoses:   Viral pharyngoconjunctivitis     Discharge Instructions      Maintain adequate hydration Take medications as prescribed We will call you with recommendations if lab results are abnormal Return to urgent care if symptoms worsen.   ED Prescriptions  Medication Sig Dispense Auth. Provider   erythromycin ophthalmic ointment Place into the right eye at bedtime for 5 days. Place a 1/2 inch ribbon of ointment into the lower eyelid. 3.5 g Lehua Flores, Myrene Galas, MD   benzonatate (TESSALON) 100 MG capsule Take 1 capsule (100 mg total) by mouth 3 (three) times daily as needed for cough. 21 capsule Tachina Spoonemore, Myrene Galas, MD   lidocaine (XYLOCAINE) 2 % solution Use as directed 15 mLs in the mouth or throat every 6 (six) hours as needed for mouth pain. 100 mL Braelynn Benning, Myrene Galas, MD      PDMP not reviewed this encounter.   Chase Picket, MD 12/15/20 1049

## 2021-03-06 ENCOUNTER — Institutional Professional Consult (permissible substitution): Payer: 59 | Admitting: Neurology

## 2021-06-08 ENCOUNTER — Ambulatory Visit (INDEPENDENT_AMBULATORY_CARE_PROVIDER_SITE_OTHER): Payer: PRIVATE HEALTH INSURANCE | Admitting: Internal Medicine

## 2021-06-08 ENCOUNTER — Encounter: Payer: Self-pay | Admitting: Internal Medicine

## 2021-06-08 VITALS — BP 138/92 | HR 88 | Temp 98.0°F | Resp 16 | Ht 75.0 in | Wt >= 6400 oz

## 2021-06-08 DIAGNOSIS — Z6841 Body Mass Index (BMI) 40.0 and over, adult: Secondary | ICD-10-CM

## 2021-06-08 DIAGNOSIS — I10 Essential (primary) hypertension: Secondary | ICD-10-CM

## 2021-06-08 DIAGNOSIS — R7303 Prediabetes: Secondary | ICD-10-CM

## 2021-06-08 DIAGNOSIS — Z0001 Encounter for general adult medical examination with abnormal findings: Secondary | ICD-10-CM

## 2021-06-08 DIAGNOSIS — R0683 Snoring: Secondary | ICD-10-CM | POA: Diagnosis not present

## 2021-06-08 LAB — CBC WITH DIFFERENTIAL/PLATELET
Basophils Absolute: 0 10*3/uL (ref 0.0–0.1)
Basophils Relative: 0.9 % (ref 0.0–3.0)
Eosinophils Absolute: 0.1 10*3/uL (ref 0.0–0.7)
Eosinophils Relative: 1.1 % (ref 0.0–5.0)
HCT: 43.1 % (ref 39.0–52.0)
Hemoglobin: 14.2 g/dL (ref 13.0–17.0)
Lymphocytes Relative: 29.3 % (ref 12.0–46.0)
Lymphs Abs: 1.5 10*3/uL (ref 0.7–4.0)
MCHC: 33 g/dL (ref 30.0–36.0)
MCV: 91.4 fl (ref 78.0–100.0)
Monocytes Absolute: 0.4 10*3/uL (ref 0.1–1.0)
Monocytes Relative: 8.8 % (ref 3.0–12.0)
Neutro Abs: 3 10*3/uL (ref 1.4–7.7)
Neutrophils Relative %: 59.9 % (ref 43.0–77.0)
Platelets: 169 10*3/uL (ref 150.0–400.0)
RBC: 4.72 Mil/uL (ref 4.22–5.81)
RDW: 14.6 % (ref 11.5–15.5)
WBC: 5.1 10*3/uL (ref 4.0–10.5)

## 2021-06-08 LAB — URINALYSIS, ROUTINE W REFLEX MICROSCOPIC
Bilirubin Urine: NEGATIVE
Hgb urine dipstick: NEGATIVE
Ketones, ur: NEGATIVE
Leukocytes,Ua: NEGATIVE
Nitrite: NEGATIVE
RBC / HPF: NONE SEEN (ref 0–?)
Specific Gravity, Urine: 1.015 (ref 1.000–1.030)
Total Protein, Urine: NEGATIVE
Urine Glucose: NEGATIVE
Urobilinogen, UA: 0.2 (ref 0.0–1.0)
WBC, UA: NONE SEEN (ref 0–?)
pH: 6.5 (ref 5.0–8.0)

## 2021-06-08 LAB — BASIC METABOLIC PANEL
BUN: 15 mg/dL (ref 6–23)
CO2: 30 mEq/L (ref 19–32)
Calcium: 8.7 mg/dL (ref 8.4–10.5)
Chloride: 101 mEq/L (ref 96–112)
Creatinine, Ser: 0.75 mg/dL (ref 0.40–1.50)
GFR: 113.65 mL/min (ref >60.00)
Glucose, Bld: 100 mg/dL — ABNORMAL HIGH (ref 70–99)
Potassium: 4.3 mEq/L (ref 3.5–5.1)
Sodium: 136 mEq/L (ref 135–145)

## 2021-06-08 LAB — HEPATIC FUNCTION PANEL
ALT: 16 U/L (ref 0–53)
AST: 15 U/L (ref 0–37)
Albumin: 3.8 g/dL (ref 3.5–5.2)
Alkaline Phosphatase: 60 U/L (ref 39–117)
Bilirubin, Direct: 0.1 mg/dL (ref 0.0–0.3)
Total Bilirubin: 0.5 mg/dL (ref 0.2–1.2)
Total Protein: 7.3 g/dL (ref 6.0–8.3)

## 2021-06-08 LAB — LIPID PANEL
Cholesterol: 129 mg/dL (ref 0–200)
HDL: 46.6 mg/dL (ref >39.00)
LDL Cholesterol: 72 mg/dL (ref 0–99)
NonHDL: 82.18
Total CHOL/HDL Ratio: 3
Triglycerides: 50 mg/dL (ref 0.0–149.0)
VLDL: 10 mg/dL (ref 0.0–40.0)

## 2021-06-08 LAB — HEMOGLOBIN A1C: Hgb A1c MFr Bld: 6.3 % (ref 4.6–6.5)

## 2021-06-08 LAB — TSH: TSH: 1.12 u[IU]/mL (ref 0.35–5.50)

## 2021-06-08 MED ORDER — OLMESARTAN MEDOXOMIL 40 MG PO TABS: 40.0000 mg | ORAL_TABLET | Freq: Every day | ORAL | 1 refills | Status: DC

## 2021-06-08 MED ORDER — INDAPAMIDE 2.5 MG PO TABS: 2.5000 mg | ORAL_TABLET | Freq: Every day | ORAL | 1 refills | Status: DC

## 2021-06-08 NOTE — Patient Instructions (Signed)
Health Maintenance, Male Adopting a healthy lifestyle and getting preventive care are important in promoting health and wellness. Ask your health care provider about: The right schedule for you to have regular tests and exams. Things you can do on your own to prevent diseases and keep yourself healthy. What should I know about diet, weight, and exercise? Eat a healthy diet  Eat a diet that includes plenty of vegetables, fruits, low-fat dairy products, and lean protein. Do not eat a lot of foods that are high in solid fats, added sugars, or sodium. Maintain a healthy weight Body mass index (BMI) is a measurement that can be used to identify possible weight problems. It estimates body fat based on height and weight. Your health care provider can help determine your BMI and help you achieve or maintain a healthy weight. Get regular exercise Get regular exercise. This is one of the most important things you can do for your health. Most adults should: Exercise for at least 150 minutes each week. The exercise should increase your heart rate and make you sweat (moderate-intensity exercise). Do strengthening exercises at least twice a week. This is in addition to the moderate-intensity exercise. Spend less time sitting. Even light physical activity can be beneficial. Watch cholesterol and blood lipids Have your blood tested for lipids and cholesterol at 40 years of age, then have this test every 5 years. You may need to have your cholesterol levels checked more often if: Your lipid or cholesterol levels are high. You are older than 40 years of age. You are at high risk for heart disease. What should I know about cancer screening? Many types of cancers can be detected early and may often be prevented. Depending on your health history and family history, you may need to have cancer screening at various ages. This may include screening for: Colorectal cancer. Prostate cancer. Skin cancer. Lung  cancer. What should I know about heart disease, diabetes, and high blood pressure? Blood pressure and heart disease High blood pressure causes heart disease and increases the risk of stroke. This is more likely to develop in people who have high blood pressure readings or are overweight. Talk with your health care provider about your target blood pressure readings. Have your blood pressure checked: Every 3-5 years if you are 18-39 years of age. Every year if you are 40 years old or older. If you are between the ages of 65 and 75 and are a current or former smoker, ask your health care provider if you should have a one-time screening for abdominal aortic aneurysm (AAA). Diabetes Have regular diabetes screenings. This checks your fasting blood sugar level. Have the screening done: Once every three years after age 45 if you are at a normal weight and have a low risk for diabetes. More often and at a younger age if you are overweight or have a high risk for diabetes. What should I know about preventing infection? Hepatitis B If you have a higher risk for hepatitis B, you should be screened for this virus. Talk with your health care provider to find out if you are at risk for hepatitis B infection. Hepatitis C Blood testing is recommended for: Everyone born from 1945 through 1965. Anyone with known risk factors for hepatitis C. Sexually transmitted infections (STIs) You should be screened each year for STIs, including gonorrhea and chlamydia, if: You are sexually active and are younger than 40 years of age. You are older than 40 years of age and your   health care provider tells you that you are at risk for this type of infection. Your sexual activity has changed since you were last screened, and you are at increased risk for chlamydia or gonorrhea. Ask your health care provider if you are at risk. Ask your health care provider about whether you are at high risk for HIV. Your health care provider  may recommend a prescription medicine to help prevent HIV infection. If you choose to take medicine to prevent HIV, you should first get tested for HIV. You should then be tested every 3 months for as long as you are taking the medicine. Follow these instructions at home: Alcohol use Do not drink alcohol if your health care provider tells you not to drink. If you drink alcohol: Limit how much you have to 0-2 drinks a day. Know how much alcohol is in your drink. In the [place] one drink equals one 12 oz bottle of beer (355 mL), one 5 oz glass of wine (148 mL), or one 1 oz glass of hard liquor (44 mL). Lifestyle Do not use any products that contain nicotine or tobacco. These products include cigarettes, chewing tobacco, and vaping devices, such as e-cigarettes. If you need help quitting, ask your health care provider. Do not use street drugs. Do not share needles. Ask your health care provider for help if you need support or information about quitting drugs. General instructions Schedule regular health, dental, and eye exams. Stay current with your vaccines. Tell your health care provider if: You often feel depressed. You have ever been abused or do not feel safe at home. Summary Adopting a healthy lifestyle and getting preventive care are important in promoting health and wellness. Follow your health care provider's instructions about healthy diet, exercising, and getting tested or screened for diseases. Follow your health care provider's instructions on monitoring your cholesterol and blood pressure. This information is not intended to replace advice given to you by your health care provider. Make sure you discuss any questions you have with your health care provider. Document Revised: 05/09/2020 Document Reviewed: 05/09/2020 Elsevier Patient Education  2023 [employer]  

## 2021-06-08 NOTE — Progress Notes (Signed)
Subjective:  Patient ID: Jose Mclaughlin, male    DOB: [birth year]  Age: 41 y.o. MRN: [id number]  CC: Annual Exam and Hypertension   HPI Jose Mclaughlin presents for f/up and a CPX.  He has not been evaluated for the snoring. He denies HA/BV/CP/DOE/SOB.  Outpatient Medications Prior to Visit  Medication Sig Dispense Refill   albuterol (VENTOLIN HFA) 108 (90 Base) MCG/ACT inhaler Inhale 2 puffs into the lungs every 6 (six) hours as needed for wheezing or shortness of breath. 8 g 2   benzonatate (TESSALON) 100 MG capsule Take 1 capsule (100 mg total) by mouth 3 (three) times daily as needed for cough. 21 capsule 0   Cholecalciferol 1.25 MG (50000 UT) capsule Take 1 capsule (50,000 Units total) by mouth once a week. 12 capsule 1   lidocaine (XYLOCAINE) 2 % solution Use as directed 15 mLs in the mouth or throat every 6 (six) hours as needed for mouth pain. 100 mL 0   olmesartan (BENICAR) 40 MG tablet Take 1 tablet (40 mg total) by mouth daily. 90 tablet 1   No facility-administered medications prior to visit.    ROS Review of Systems  Constitutional:  Positive for unexpected weight change (wt gain). Negative for chills, diaphoresis and fatigue.  HENT: Negative.    Eyes: Negative.   Respiratory:  Negative for cough, chest tightness, shortness of breath and wheezing.   Cardiovascular:  Positive for leg swelling. Negative for chest pain and palpitations.  Gastrointestinal:  Negative for abdominal pain, diarrhea, nausea and vomiting.  Endocrine: Negative.   Genitourinary: Negative.  Negative for difficulty urinating, scrotal swelling and testicular pain.  Musculoskeletal:  Negative for arthralgias, joint swelling and myalgias.  Skin: Negative.  Negative for rash.  Neurological:  Negative for dizziness, weakness and light-headedness.  Hematological:  Negative for adenopathy. Does not bruise/bleed easily.  Psychiatric/Behavioral: Negative.      Objective:  BP (!) 138/92 (BP Location: Left  Arm, Patient Position: Sitting, Cuff Size: Large) Comment: thigh cuff  Pulse 88   Temp 98 F (36.7 C) (Oral)   Resp 16   Ht 6\' 3"  (1.905 m)   Wt (!) 531 lb (240.9 kg)   SpO2 91%   BMI 66.37 kg/m   BP Readings from Last 3 Encounters:  06/08/21 (!) 138/92  12/15/20 (!) 166/93  12/06/20 132/86    Wt Readings from Last 3 Encounters:  06/08/21 (!) 531 lb (240.9 kg)  12/06/20 (!) 524 lb (237.7 kg)  03/29/20 (!) 490 lb (222.3 kg)    Physical Exam Vitals reviewed.  HENT:     Nose: Nose normal.     Mouth/Throat:     Mouth: Mucous membranes are moist.  Eyes:     General: No scleral icterus.    Conjunctiva/sclera: Conjunctivae normal.  Cardiovascular:     Rate and Rhythm: Normal rate and regular rhythm.     Heart sounds: No murmur heard. Pulmonary:     Effort: Pulmonary effort is normal.     Breath sounds: No stridor. No wheezing, rhonchi or rales.  Abdominal:     General: Abdomen is protuberant. Bowel sounds are normal. There is no distension.     Palpations: Abdomen is soft. There is no hepatomegaly, splenomegaly or mass.     Tenderness: There is no abdominal tenderness.  Musculoskeletal:        General: Normal range of motion.     Cervical back: Neck supple.     Right lower leg: Edema present.  Left lower leg: Edema present.     Comments: BLE -elephantiasis and trace pitting edema.  Lymphadenopathy:     Cervical: No cervical adenopathy.  Neurological:     Mental Status: He is alert.     Lab Results  Component Value Date   WBC 5.1 06/08/2021   HGB 14.2 06/08/2021   HCT 43.1 06/08/2021   PLT 169.0 06/08/2021   GLUCOSE 100 (H) 06/08/2021   CHOL 129 06/08/2021   TRIG 50.0 06/08/2021   HDL 46.60 06/08/2021   LDLCALC 72 06/08/2021   ALT 16 06/08/2021   AST 15 06/08/2021   NA 136 06/08/2021   K 4.3 06/08/2021   CL 101 06/08/2021   CREATININE 0.75 06/08/2021   BUN 15 06/08/2021   CO2 30 06/08/2021   TSH 1.12 06/08/2021   HGBA1C 6.3 06/08/2021    No  results found.  Assessment & Plan:   Jose Mclaughlin was seen today for annual exam and hypertension.  Diagnoses and all orders for this visit:  Morbid obesity with BMI of 60.0-69.9, adult (HCC)- He is prediabetic.  I recommend that he be evaluated for sleep apnea and to improve his lifestyle modifications. -     Basic metabolic panel; Future -     Hepatic function panel; Future -     Hemoglobin A1c; Future -     TSH; Future -     TSH -     Hemoglobin A1c -     Hepatic function panel -     Basic metabolic panel  Primary hypertension- His blood pressure is not adequately well controlled and he has trace lower extremity pitting edema.  Will add indapamide to the ARB. -     olmesartan (BENICAR) 40 MG tablet; Take 1 tablet (40 mg total) by mouth daily. -     Basic metabolic panel; Future -     CBC with Differential/Platelet; Future -     Hepatic function panel; Future -     TSH; Future -     Urinalysis, Routine w reflex microscopic; Future -     Urinalysis, Routine w reflex microscopic -     TSH -     Hepatic function panel -     CBC with Differential/Platelet -     Basic metabolic panel  Prediabetes -     Basic metabolic panel; Future -     Hepatic function panel; Future -     Hemoglobin A1c; Future -     Hemoglobin A1c -     Hepatic function panel -     Basic metabolic panel  Encounter for general adult medical examination with abnormal findings- Exam completed, labs reviewed, vaccines are up-to-date, no cancer screenings indicated, patient education was given. -     Lipid panel; Future -     Lipid panel  Snoring -     Ambulatory referral to Sleep Studies   I have discontinued [name]'s albuterol, Cholecalciferol, benzonatate, and lidocaine. I am also having him start on indapamide. Additionally, I am having him maintain his olmesartan.  Meds ordered this encounter  Medications   olmesartan (BENICAR) 40 MG tablet    Sig: Take 1 tablet (40 mg total) by mouth daily.     Dispense:  90 tablet    Refill:  1   indapamide (LOZOL) 2.5 MG tablet    Sig: Take 1 tablet (2.5 mg total) by mouth daily.    Dispense:  90 tablet    Refill:  1  Follow-up: Return in about 6 months (around 12/08/2021).  Sanda Lingerhomas Marilyn Nihiser, MD

## 2021-07-06 ENCOUNTER — Ambulatory Visit (INDEPENDENT_AMBULATORY_CARE_PROVIDER_SITE_OTHER): Payer: Commercial Managed Care - HMO | Admitting: Nurse Practitioner

## 2021-07-06 VITALS — BP 128/74 | HR 78 | Temp 98.3°F | Resp 18 | Ht 75.0 in | Wt >= 6400 oz

## 2021-07-06 DIAGNOSIS — M25521 Pain in right elbow: Secondary | ICD-10-CM

## 2021-07-06 MED ORDER — PREDNISONE 20 MG PO TABS: 40.0000 mg | ORAL_TABLET | Freq: Every day | ORAL | 0 refills | Status: DC

## 2021-07-06 NOTE — Assessment & Plan Note (Addendum)
Ordered prednisone 40 mg QD for 5 days. Also sent a referral to Sports medicine for possible imaging and steroid injection. Educated on RICE (Rest, Ice, Compression and Elevation). Also educated patient on potential side effects of medication and not to take NSAID's while taking prednisone (take Tylenol instead). Shared decision making on what he feels is best for him based on availability of referral and degree of elbow pain. Patient was educated on side effects of prednisone including insomnia, increased risk of GI bleed, increased appetite, and irritability.  He was told to take medication in the morning with food and to avoid NSAID use while taking the medication.  The patient reports their understanding.

## 2021-07-06 NOTE — Progress Notes (Signed)
Established Patient Office Visit  Subjective   Patient ID: Jose Mclaughlin, male    DOB: 1980-01-19  Age: 41 y.o. MRN: [id number]  Chief Complaint  Patient presents with   elbow pain    Right elbow, sore last week    Arm Pain  The incident occurred more than 1 week ago. The injury mechanism was repetitive motion. The pain is present in the right elbow. The quality of the pain is described as aching and burning. The pain does not radiate. The pain is moderate. The pain has been Fluctuating since the incident. Pertinent negatives include no chest pain, muscle weakness, numbness or tingling. The symptoms are aggravated by lifting and movement (extension). He has tried NSAIDs and rest for the symptoms. The treatment provided no relief.      Review of Systems  Constitutional:  Negative for chills, fever and malaise/fatigue.  HENT:  Negative for congestion, sinus pain and sore throat.   Eyes:  Negative for blurred vision and double vision.  Respiratory:  Negative for cough and shortness of breath.   Cardiovascular:  Positive for leg swelling. Negative for chest pain.  Gastrointestinal:  Negative for abdominal pain, constipation, diarrhea, heartburn and nausea.  Genitourinary:  Negative for frequency, hematuria and urgency.  Musculoskeletal:  Positive for joint pain (rt elbow). Negative for myalgias.  Skin:  Negative for rash.  Neurological:  Negative for tingling and numbness.      Objective:     BP 128/74 (BP Location: Left Arm, Patient Position: Sitting, Cuff Size: Large)   Pulse 78   Temp 98.3 F (36.8 C) (Oral)   Resp 18   Ht 6\' 3"  (1.905 m)   Wt (!) 533 lb (241.8 kg)   SpO2 94%   BMI 66.62 kg/m  BP Readings from Last 3 Encounters:  07/06/21 128/74  06/08/21 (!) 138/92  12/15/20 (!) 166/93      Physical Exam Constitutional:      Appearance: He is obese.  HENT:     Head: Normocephalic.  Cardiovascular:     Rate and Rhythm: Normal rate and regular rhythm.      Pulses: Normal pulses.     Heart sounds: Normal heart sounds.  Pulmonary:     Effort: Pulmonary effort is normal.     Breath sounds: Normal breath sounds.  Skin:    General: Skin is warm.  Neurological:     Mental Status: He is alert and oriented to person, place, and time.      No results found for any visits on 07/06/21.    The ASCVD Risk score (Arnett DK, et al., 2019) failed to calculate for the following reasons:   The 2019 ASCVD risk score is only valid for ages 82 to 79    Assessment & Plan:   Problem List Items Addressed This Visit       Other   Right elbow pain - Primary    Ordered prednisone 40 mg QD for 5 days. Also sent a referral to Sports medicine for possible imaging and steroid injection. Educated on RICE (Rest, Ice, Compression and Elevation). Also educated patient on potential side effects of medication and not to take NSAID's while taking prednisone (take Tylenol instead). Shared decision making on what he feels is best for him based on availability of referral and degree of elbow pain. Patient was educated on side effects of prednisone including insomnia, increased risk of GI bleed, increased appetite, and irritability.  He was told to take medication in  the morning with food and to avoid NSAID use while taking the medication.  The patient reports their understanding.       Relevant Medications   predniSONE (DELTASONE) 20 MG tablet   Other Relevant Orders   Ambulatory referral to Sports Medicine    Return if symptoms worsen or fail to improve.    Elenore Paddy, NP

## 2021-07-18 ENCOUNTER — Ambulatory Visit: Payer: PRIVATE HEALTH INSURANCE | Admitting: Family Medicine

## 2021-07-24 ENCOUNTER — Encounter: Payer: Self-pay | Admitting: Neurology

## 2021-07-24 ENCOUNTER — Ambulatory Visit (INDEPENDENT_AMBULATORY_CARE_PROVIDER_SITE_OTHER): Payer: Commercial Managed Care - HMO | Admitting: Neurology

## 2021-07-24 VITALS — BP 136/72 | HR 75 | Ht 75.0 in | Wt >= 6400 oz

## 2021-07-24 DIAGNOSIS — G4726 Circadian rhythm sleep disorder, shift work type: Secondary | ICD-10-CM

## 2021-07-24 DIAGNOSIS — R351 Nocturia: Secondary | ICD-10-CM

## 2021-07-24 DIAGNOSIS — Z9189 Other specified personal risk factors, not elsewhere classified: Secondary | ICD-10-CM

## 2021-07-24 DIAGNOSIS — R0683 Snoring: Secondary | ICD-10-CM

## 2021-07-24 DIAGNOSIS — Z6841 Body Mass Index (BMI) 40.0 and over, adult: Secondary | ICD-10-CM

## 2021-07-24 NOTE — Patient Instructions (Signed)

## 2021-07-24 NOTE — Progress Notes (Signed)
Subjective:    Patient ID: Jose Mclaughlin is a 41 y.o. male.  HPI    Huston Foley, MD, PhD [facility] [address] [address] [place], [place] [address]  Dear Dr. Yetta Barre,   I saw your patient, [name], upon your kind request in my sleep clinic today for initial consultation of his sleep disorder, in particular, concern for underlying obstructive sleep apnea.  The patient is unaccompanied today.  He missed an appointment on 03/06/21. As you know, Mr. Haq is a 41 year old right-handed gentleman with an underlying medical history of hypertension, prediabetes, hematuria and morbid obesity with a BMI of over 60, who reports snoring and excessive daytime somnolence.  I reviewed your office note from 12/06/2020 as well as 06/08/21.  His Epworth sleepiness score is 11 out of 24, fatigue severity score is 15 out of 63.  He is single and lives with a roommate.  He denies recurrent nocturnal or morning headaches but has nocturia about twice per average night.  He has an erratic sleep schedule because of his work schedule.  He works as a Electrical engineer at a bar.  He typically works Saturdays and Sundays and Tuesdays and Thursdays, 9 PM to 4 PM.  Bedtime for work schedule is around 5 AM and rise time around 11 AM.  When he is not working he will go to bed around midnight and sleep till 9 PM.  He has recently quit smoking in March 2023.  He does not watch TV in his bedroom.  He has a kitten, no children, drinks caffeine in the form of tea, 1 or 2/day on average, alcohol rarely.  He is adopted, and is not aware of any family history.  His Past Medical History Is Significant For: Past Medical History:  Diagnosis Date   Hypertension    Leg fracture, left     His Past Surgical History Is Significant For: History reviewed. No pertinent surgical history.  His Family History Is Significant For: Family History  Adopted: Yes  Problem Relation Age of Onset   Sleep apnea  Neg Hx     His Social History Is Significant For: Social History   Socioeconomic History   Marital status: Single    Spouse name: Not on file   Number of children: Not on file   Years of education: Not on file   Highest education level: Not on file  Occupational History   Not on file  Tobacco Use   Smoking status: Former    Packs/day: 0.50    Years: 22.00    Total pack years: 11.00    Types: Cigarettes    Start date: 03/29/1997    Quit date: 03/02/2021    Years since quitting: 0.3   Smokeless tobacco: Never  Substance and Sexual Activity   Alcohol use: Yes    Alcohol/week: 18.0 standard drinks of alcohol    Types: 18 Cans of beer per week    Comment: 2-3 beers daily   Drug use: No   Sexual activity: Not Currently    Partners: Female  Other Topics Concern   Not on file  Social History Narrative   Not on file   Social Determinants of Health   Financial Resource Strain: Not on file  Food Insecurity: Not on file  Transportation Needs: Not on file  Physical Activity: Not on file  Stress: Not on file  Social Connections: Not on file    His Allergies Are:  No Known Allergies:  His Current Medications Are:  Outpatient Encounter Medications as of 07/24/2021  Medication Sig   indapamide (LOZOL) 2.5 MG tablet Take 1 tablet (2.5 mg total) by mouth daily.   olmesartan (BENICAR) 40 MG tablet Take 1 tablet (40 mg total) by mouth daily.   predniSONE (DELTASONE) 20 MG tablet Take 2 tablets (40 mg total) by mouth daily with breakfast.   [DISCONTINUED] diphenhydrAMINE (SOMINEX) 25 MG tablet Take 25 mg by mouth at bedtime as needed for allergies or sleep.   No facility-administered encounter medications on file as of 07/24/2021.  :   Review of Systems:  Out of a complete 14 point review of systems, all are reviewed and negative with the exception of these symptoms as listed below:   Review of Systems  Neurological:        Pt here for sleep consult   Pt snores,hypertension  . Pt denies fatigue ,headaches,sleep study,CPAP machine     ESS:11  FSS: 15    Objective:  Neurological Exam  Physical Exam Physical Examination:   Vitals:   07/24/21 1326  BP: 136/72  Pulse: 75    General Examination: The patient is a very pleasant 41 y.o. male in no acute distress. He appears well-developed and well-nourished and well groomed.   HEENT: Normocephalic, atraumatic, pupils are equal, round and reactive to light, extraocular tracking is good without limitation to gaze excursion or nystagmus noted. Hearing is grossly intact. Face is symmetric with normal facial animation. Speech is clear with no dysarthria noted. There is no hypophonia. There is no lip, neck/head, jaw or voice tremor. Neck is supple with full range of passive and active motion. There are no carotid bruits on auscultation. Oropharynx exam reveals: mild mouth dryness, adequate dental hygiene with frontal teeth missing, moderate airway crowding to marked crowding secondary to thicker soft palate, tonsillar size of about 2-3+, larger uvula.  Mallampati class III.  Neck circumference of 20-1/2 inches.  Tongue protrudes centrally and palate elevates symmetrically.   Chest: Clear to auscultation without wheezing, rhonchi or crackles noted.  Heart: S1+S2+0, regular and normal without murmurs, rubs or gallops noted.   Abdomen: Soft, non-tender and non-distended, obese.   Extremities: There is significant swelling both lower extremities.   Skin: Warm and dry without trophic changes noted.   Musculoskeletal: exam reveals no obvious joint deformities.   Neurologically:  Mental status: The patient is awake, alert and oriented in all 4 spheres. His immediate and remote memory, attention, language skills and fund of knowledge are appropriate. There is no evidence of aphasia, agnosia, apraxia or anomia. Speech is clear with normal prosody and enunciation. Thought process is linear. Mood is normal and affect is  normal.  Cranial nerves II - XII are as described above under HEENT exam.  Motor exam: Normal bulk, strength and tone is noted. There is no obvious tremor. Fine motor skills and coordination: grossly intact.  Cerebellar testing: No dysmetria or intention tremor. There is no truncal or gait ataxia.  Sensory exam: intact to light touch in the upper and lower extremities.  Gait, station and balance: He stands easily. No veering to one side is noted. No leaning to one side is noted. Posture is age-appropriate and stance is narrow based. Gait shows normal stride length and normal pace. No problems turning are noted.  Assessment and plan:  In summary, Sloan Takagi is a very pleasant 41 y.o.-year old male with an underlying medical history of hypertension, prediabetes, hematuria and morbid obesity with a BMI  of over 60, whose history and physical exam are concerning for sleep disordered breathing, supporting a current working diagnosis of unspecified sleep apnea, with the main differential diagnoses of obstructive sleep apnea (OSA) versus upper airway resistance syndrome (UARS) versus central sleep apnea (CSA), or mixed sleep apnea. A laboratory attended sleep study is considered gold standard for evaluation of sleep disordered breathing and is recommended at this time and clinically justified.   I had a long chat with the patient about my findings and the diagnosis of sleep apnea, particularly OSA, its prognosis and treatment options. We talked about medical/conservative treatments, surgical interventions and non-pharmacological approaches for symptom control. I explained, in particular, the risks and ramifications of untreated moderate to severe OSA, especially with respect to developing cardiovascular disease down the road, including congestive heart failure (CHF), difficult to treat hypertension, cardiac arrhythmias (particularly A-fib), neurovascular complications including TIA, stroke and dementia. Even type  2 diabetes has, in part, been linked to untreated OSA. Symptoms of untreated OSA may include (but may not be limited to) daytime sleepiness, nocturia (i.e. frequent nighttime urination), memory problems, mood irritability and suboptimally controlled or worsening mood disorder such as depression and/or anxiety, lack of energy, lack of motivation, physical discomfort, as well as recurrent headaches, especially morning or nocturnal headaches. We talked about the importance of maintaining a healthy lifestyle and striving for healthy weight.  We talked about how shifting sleep schedules affects daytime symptoms.  He is shifting work and sleep schedule likely plays a role in his daytime somnolence. I recommended the following at this time: sleep study.  I outlined the differences between a laboratory attended sleep study which is considered more comprehensive and accurate over the option of a home sleep test (HST); the latter may lead to underestimation of sleep disordered breathing in some instances and does not help with diagnosing upper airway resistance syndrome and is not accurate enough to diagnose primary central sleep apnea typically. I explained the different sleep test procedures to the patient in detail and also outlined possible surgical and non-surgical treatment options of OSA, including the use of a pressure airway pressure (PAP) device (ie CPAP, AutoPAP/APAP or BiPAP in certain circumstances), a custom-made dental device (aka oral appliance, which would require a referral to a specialist dentist or orthodontist typically, and is generally speaking not considered a good choice for patients with full dentures or edentulous state), upper airway surgical options, such as traditional UPPP (which is not considered a first-line treatment) or the Inspire device (hypoglossal nerve stimulator, which would involve a referral for consultation with an ENT surgeon, after careful selection, following inclusion  criteria). I explained the PAP treatment option to the patient in detail, as this is generally considered first-line treatment.  The patient indicated that he would be willing to try PAP therapy, if the need arises. I explained the importance of being compliant with PAP treatment, not only for insurance purposes but primarily to improve patient's symptoms symptoms, and for the patient's long term health benefit, including to reduce His cardiovascular risks longer-term.    We will pick up our discussion about the next steps and treatment options after testing.  We will keep him posted as to the test results by phone call and/or MyChart messaging where possible.  We will plan to follow-up in sleep clinic accordingly as well.  I answered all his questions today and the patient was in agreement.   I encouraged him to call with any interim questions, concerns, problems or updates  or email [place] through MyChart.  Generally speaking, sleep test authorizations may take up to 2 weeks, sometimes less, sometimes longer, the patient is encouraged to get in touch with [place] if they do not hear back from the sleep lab staff directly within the next 2 weeks.  Thank you very much for allowing me to participate in the care of this nice patient. If I can be of any further assistance to you please do not hesitate to call me at [phone].  Sincerely,   Huston Foley, MD, PhD

## 2021-08-15 ENCOUNTER — Telehealth: Payer: Self-pay | Admitting: Neurology

## 2021-08-15 NOTE — Telephone Encounter (Signed)
[employer] pending faxed notes  

## 2021-08-24 NOTE — Telephone Encounter (Signed)
[employer]- [employer] auth: [id number] (exp. 08/15/21 to 11/13/21).  Patient is scheduled at [facility] for 09/20/21 at 9 pm.  Mailed packet to the patient.

## 2021-09-20 ENCOUNTER — Ambulatory Visit: Payer: Commercial Managed Care - HMO | Admitting: Neurology

## 2021-09-20 DIAGNOSIS — R351 Nocturia: Secondary | ICD-10-CM

## 2021-09-20 DIAGNOSIS — G4726 Circadian rhythm sleep disorder, shift work type: Secondary | ICD-10-CM

## 2021-09-20 DIAGNOSIS — G4733 Obstructive sleep apnea (adult) (pediatric): Secondary | ICD-10-CM | POA: Diagnosis not present

## 2021-09-20 DIAGNOSIS — R0683 Snoring: Secondary | ICD-10-CM

## 2021-09-20 DIAGNOSIS — Z9189 Other specified personal risk factors, not elsewhere classified: Secondary | ICD-10-CM

## 2021-09-20 DIAGNOSIS — G4720 Circadian rhythm sleep disorder, unspecified type: Secondary | ICD-10-CM

## 2021-09-27 NOTE — Procedures (Unsigned)
[facility] SPLIT NIGHT INTERPRETATION REPORT   STUDY DATE: 09/20/2021     PATIENT NAME:  Jose Mclaughlin, Jose Mclaughlin         DATE OF BIRTH:  05-Dec-1980  PATIENT ID:  [id number]    TYPE OF STUDY:  PSG  READING PHYSICIAN: Huston Foley, MD, PSG SCORING TECHNICIAN: ACQ System 1   INDICATIONS/HISTORY: 41 year old male with a history of hypertension, prediabetes, hematuria and morbid obesity with a BMI of over 60, who reports snoring and excessive daytime somnolence. His Epworth sleepiness score is 11 out of 24, fatigue severity score is 15 out of 63. Height: 75.0 in Weight: 530 lb (BMI 66) Neck Size: 20.5 in Medications: Lozol, Benicar, Deltasone  DESCRIPTION: A sleep technologist was in attendance for the duration of the recording.  Data collection, scoring, video monitoring, and reporting were performed in compliance with the AASM Manual for the Scoring of Sleep and Associated Events; (Hypopnea is scored based on the criteria listed in Section VIII D. 1b in the AASM Manual V2.6 using a 4% oxygen desaturation rule or Hypopnea is scored based on the criteria listed in Section VIII D. 1a in the AASM Manual V2.6 using 3% oxygen desaturation and /or arousal rule).  A physician certified by the American Board of Sleep Medicine reviewed each epoch of the study.  STUDY DETAILS: Lights off was at 22:02: and lights on 05:06: (424 minutes hours in bed). This study was performed with an initial diagnostic portion followed by positive airway pressure titration.  The patient qualified for an emergency split study due to severe sleep apnea. His baseline AHI was 104.15/h, O2 nadir 58%.  DIAGNOSTIC ANALYSIS   SLEEP CONTINUITY AND SLEEP ARCHITECTURE:  The diagnostic portion of the study began at 22:02 and ended at 00:42, for a recording time of 2h 39.83m minutes.  Total sleep time was 79 minutes minutes (47.2% supine;  52.8% lateral;  0.0% prone, 0.0% REM sleep), with a decreased sleep  efficiency at 49.8%. Sleep latency was increased at 36.0 minutes. REM sleep latency was decreased at 0.0 minutes.  Arousal index was 85.3 /hr. Of the total sleep time, the percentage of stage N1 sleep was 10.7%, which is increased, stage N2 sleep was 89.3%, which is markedly increased, stage N3 and REM sleep were absent.  Wake after sleep onset (WASO) time accounted for 44 minutes with mild to moderate sleep fragmentation noted.   AROUSAL (Baseline): There were 109.0 arousals in total, for an arousal index of 82.3 arousals/hour.  Of these, 107.0 were identified as respiratory-related arousals (80.8 /hr), 0 were PLM-related arousals (0.0 /hr), and 6 were non-specific arousals (4.5 /hr).    RESPIRATORY MONITORING:  Based on CMS criteria (using a 4% oxygen desaturation rule for scoring hypopneas), there were 198 apneas (194 obstructive; 0 central; 4 mixed), and 13 hypopneas.  Apnea index was 101.9. Hypopnea index was 2.3. The apnea-hypopnea index was 104.2/hour overall (49.1 supine; 0.0 REM, 0.0 supine REM). There were 0 respiratory effort-related arousals (RERAs).  The RERA index was 0.0 events/hr. Total respiratory disturbance index (RDI) was 104.2 events/hr. RDI results showed: supine RDI  104.0 /hr; non-supine RDI 104.3 /hr; REM RDI 0.0 /hr, supine REM RDI 0.0 /hr.  Based on AASM criteria (using a 3% oxygen desaturation and /or arousal rule for scoring hypopneas), there were 198 apneas (194 obstructive; 0 central; 4 mixed), and 13 hypopneas.  Apnea index was 101.9. Hypopnea index was 2.3. The apnea-hypopnea index was 104.2 overall (49.1 supine; 0.0 REM,  0.0 supine REM). There were 0 respiratory effort-related arousals (RERAs). Total respiratory disturbance index (RDI) was 104.2 events/hr. RDI results showed: supine RDI 104.0 /hr; non-supine RDI 104.3 /hr; REM RDI 0.0 /hr, supine REM RDI 0.0 /hr.   Respiratory events were associated with oxyhemoglobin desaturations (nadir 58%) from a normal baseline (mean  93%). Total time spent at, or below 88% was 60.4 minutes, or 75.9%  of total sleep time. Snoring was absent. There were 0.0 occurrences of Cheyne Stokes breathing.   LIMB MOVEMENTS: There were 0 periodic limb movements of sleep (0.0/hr), of which 0 (0.0/hr) were associated with an arousal.   OXIMETRY: Total sleep time spent at, or below 88% was 54.6 minutes, or 68.7% of total sleep time. Snoring was classified as mild to moderate.   BODY POSITION: Duration of total sleep and percent of total sleep in their respective position is as follows: supine 37 minutes minutes (47.2%), non-supine 42.0 minutes (52.8%); right 00 minutes minutes (0.0%), left 42 minutes minutes (52.8%), and prone 00 minutes minutes (0.0%). Total supine REM sleep time was 00 minutes minutes (0.0% of total REM sleep).   Analysis of electrocardiogram activity showed the highest heart rate for the baseline portion of the study was 99.0 beats per minute.  The average heart rate during sleep was 70 bpm, while the highest heart rate for the same period was 83 bpm.    TREATMENT ANALYSIS SLEEP CONTINUITY AND SLEEP ARCHITECTURE:  The treatment portion of the study began at 00:42 and ended at 05:06, for a recording time of 4h 24.70m minutes.  Total sleep time was 247 minutes minutes (2.8% supine;  96.8% lateral; 0.4% prone, 35.0% REM sleep), with a normal sleep efficiency at 93.4%. Sleep latency was normal at 5.5 minutes. REM sleep latency was normal at 71.0 minutes.  Arousal index was 12.6 /hr. Of the total sleep time, the percentage of stage N1 sleep was 3.6%, stage N3 sleep was 25.3%, and REM sleep was 35.0%, which is increased and may reflect rebound. There were 2 Stage R periods observed during this portion of the study, 8 awakenings (i.e. transitions to Stage W from any sleep stage), and 29.0 total stage transitions. Wake after sleep onset (WASO) time accounted for 12 minutes.   AROUSAL: There were 46.0 arousals in total, for an arousal  index of 11.2 arousals/hour.  Of these, 43.0 were identified as respiratory-related arousals (10.4 /hr), 0 were PLM-related arousals (0.0 /hr), and 10 were non-specific arousals (2.4 /hr)  RESPIRATORY MONITORING and Titration details:  The patient was fitted with a medium Simplus fullface mask from [employer]. CPAP was initiated at 5 cm with heated humidity and gradually increased to a final titration pressure of 15 cm. On the final pressure he achieved 168 minutes of total sleep time. His AHI was reduced to 3.21/h, O2 nadir 84%. Non-supine REM sleep was achieved. Respiratory events were associated with oxyhemoglobin desaturation (nadir 68.0%) from a mean of 96.0%.  Total time spent at, or below 88% was 30.9 minutes, or 12.5%  of total sleep time.  Snoring was absent:  . There were 0.0 occurrences of Cheyne Stokes breathing.  LIMB MOVEMENTS: There were 0 periodic limb movements of sleep (0.0/hr), of which 0 (0.0/hr) were associated with an arousal.   OXIMETRY: Total sleep time spent at, or below 88% was 28.7 minutes, or 11.6% of total sleep time. Snoring was classified as improved.   BODY POSITION: Duration of total sleep and percent of total sleep in their respective position is  as follows: supine 07 minutes minutes (2.8%), non-supine 240.0 minutes (97.2%); right 239 minutes minutes (96.8%), left 00 minutes minutes (0.0%), and prone 01 minutes minutes (0.4%). Total supine REM sleep time was 00 minutes minutes (0.0% of total REM sleep).   Analysis of electrocardiogram activity showed the highest heart rate for the treatment portion of the study was 93.0 beats per minute.  The average heart rate during sleep was 71 bpm, while the highest heart rate for the same period was 93 bpm.  The video and audio analysis did not show any abnormal or unusual behaviors, movements, phonations or vocalizations. The patient took 1 restroom visit for the night. Post study, the patient indicated, that sleep was better  than usual.   EEG: With the limited montage recorded, no EEG abnormalities were observed.    CARDIAC: The electrocardiogram showed  normal sinus rhythm.  The average heart rate during sleep was 71 bpm.  The maximum heart rate during sleep was 93 bpm. The maximum heart rate during recording was 99.    BEHAVIORAL: No significant parasomnia behavior noted.   IMPRESSION: 1. Severe obstructive sleep apnea (OSA) 2. Dysfunctions associated with sleep stages or arousal from sleep  RECOMMENDATIONS: 1. This patient has severe obstructive sleep apnea and responded well on CPAP therapy. The patient qualified for an emergency split study due to severe sleep apnea.  His baseline AHI was 104.2/h, O2 nadir 58%. Please noted, that the absence of REM sleep during the baseline portion of the study likely underestimates his sleep disordered breathing. Due to his brief desaturation to 84% on the final titration pressure of 15 cm, I recommend a home CPAP treatment pressure of 16 cm via medium fullface mask with heated humidity.  The patient will be advised to be fully compliant with PAP therapy to improve sleep related symptoms and decrease long term cardiovascular risks. Please note that untreated obstructive sleep apnea may carry additional perioperative morbidity. Patients with significant obstructive sleep apnea should receive perioperative PAP therapy and the surgeons and particularly the anesthesiologist should be informed of the diagnosis and the severity of the sleep disordered breathing. 2. This study shows sleep fragmentation and abnormal sleep stage percentages; these are nonspecific findings and per se do not signify an intrinsic sleep disorder or a cause for the patient's sleep-related symptoms. Causes include (but are not limited to) the first night effect of the sleep study, circadian rhythm disturbances, medication effect or an underlying mood disorder or medical problem.  3. The patient should be cautioned  not to drive, work at heights, or operate dangerous or heavy equipment when tired or sleepy. Review and reiteration of good sleep hygiene measures should be pursued with any patient. 4. The patient will be seen in follow-up in the sleep clinic at [facility] for discussion of the test results, symptom and treatment compliance review, further management strategies, etc. The referring provider will be notified of the test results.   I certify that I have reviewed the entire raw data recording prior to the issuance of this report in accordance with the Standards of Accreditation of the American Academy of Sleep Medicine (AASM). Huston Foley, MD, PhD Diplomat, ABPN (Neurology and Sleep)

## 2021-09-28 ENCOUNTER — Telehealth: Payer: Self-pay | Admitting: *Deleted

## 2021-09-28 NOTE — Telephone Encounter (Signed)
Pt called back. Requested a return call from nurse.

## 2021-09-28 NOTE — Telephone Encounter (Signed)
I called the patient back and LVM for call back.

## 2021-09-28 NOTE — Telephone Encounter (Signed)
I called the pt & LVM (ok per DPR) with office number asking for call back as soon as possible.

## 2021-09-28 NOTE — Telephone Encounter (Signed)
The patient returned my call. We discussed his sleep study results. He is amenable to proceeding with CPAP therapy at home for treatment of his severe OSA. He doesn't have preference for DME. He understands insurance compliance requirements which includes using the machine at least 4 hours at night and also being seen in our office for initial follow-up between 30 and 90 days after setup. Pt was scheduled for an appt on 12/20/21 at 1:15 pm. He will watch for a call from [place]. His questions were answered and he verbalized appreciation for the call.   Letter sent to pt via mychart. Urgent CPAP referral faxed to [facility]. Received a receipt of confirmation.

## 2021-09-28 NOTE — Telephone Encounter (Signed)
-----   Message from Star Age, MD sent at 09/28/2021 12:50 PM EDT ----- Urgent set up requested due to severe OSA. Patient referred by PCP, seen by me on 07/24/21, patient had a split night sleep study on 09/20/21. Please call and notify patient that the recent sleep study showed severe obstructive sleep apnea (OSA). He did well with CPAP during the study with significant improvement of the respiratory events. I would like start the patient on a CPAP machine for home use. I placed the order in the chart.  Please advise patient that we will need a follow up appointment with either myself or one of our nurse practitioners in about 2-3 months post set-up to check for how they are doing on treatment and how well it's going with the machine in general. Most insurance company require a certain compliance percentage to continue to cover/pay for the machine. Please ask patient to schedule this FU appointment, according to the set-up date, which is the day they receive the machine. Please make sure, the patient understands the importance of keeping this window for the FU appointment, as it is often an Designer, industrial/product and not our rule. Failing to adhere to this may result in losing coverage for sleep apnea treatment, at which point some insurances require repeating the whole process. Plus, monitoring compliance data is usually good feedback for the patient as far as how they are doing, how many hours they are on it, how well the mask fits, etc.  Also remind patient, that any PAP machine or mask issues should be first addressed with the DME company, who provided the machine/supplies.  Please ask if patient has a preference regarding DME company, may depend on the insurance too.  Star Age, MD, PhD [facility])

## 2021-09-28 NOTE — Addendum Note (Signed)
Addended by: Star Age on: 09/28/2021 12:50 PM   Modules accepted: Orders

## 2021-12-07 ENCOUNTER — Other Ambulatory Visit: Payer: Self-pay | Admitting: Internal Medicine

## 2021-12-07 DIAGNOSIS — I10 Essential (primary) hypertension: Secondary | ICD-10-CM

## 2021-12-11 ENCOUNTER — Ambulatory Visit (INDEPENDENT_AMBULATORY_CARE_PROVIDER_SITE_OTHER): Payer: Commercial Managed Care - HMO | Admitting: Internal Medicine

## 2021-12-11 ENCOUNTER — Encounter: Payer: Self-pay | Admitting: Internal Medicine

## 2021-12-11 VITALS — BP 120/74 | HR 87 | Temp 98.4°F | Ht 75.0 in | Wt >= 6400 oz

## 2021-12-11 DIAGNOSIS — I10 Essential (primary) hypertension: Secondary | ICD-10-CM | POA: Diagnosis not present

## 2021-12-11 DIAGNOSIS — R7303 Prediabetes: Secondary | ICD-10-CM

## 2021-12-11 LAB — BASIC METABOLIC PANEL
BUN: 17 mg/dL (ref 6–23)
CO2: 29 mEq/L (ref 19–32)
Calcium: 9 mg/dL (ref 8.4–10.5)
Chloride: 101 mEq/L (ref 96–112)
Creatinine, Ser: 0.87 mg/dL (ref 0.40–1.50)
GFR: 108.28 mL/min (ref >60.00)
Glucose, Bld: 105 mg/dL — ABNORMAL HIGH (ref 70–99)
Potassium: 3.9 mEq/L (ref 3.5–5.1)
Sodium: 137 mEq/L (ref 135–145)

## 2021-12-11 LAB — CBC WITH DIFFERENTIAL/PLATELET
Basophils Absolute: 0 10*3/uL (ref 0.0–0.1)
Basophils Relative: 0.5 % (ref 0.0–3.0)
Eosinophils Absolute: 0.1 10*3/uL (ref 0.0–0.7)
Eosinophils Relative: 1.4 % (ref 0.0–5.0)
HCT: 40.7 % (ref 39.0–52.0)
Hemoglobin: 13.9 g/dL (ref 13.0–17.0)
Lymphocytes Relative: 22.8 % (ref 12.0–46.0)
Lymphs Abs: 1.3 10*3/uL (ref 0.7–4.0)
MCHC: 34.1 g/dL (ref 30.0–36.0)
MCV: 89.6 fl (ref 78.0–100.0)
Monocytes Absolute: 0.5 10*3/uL (ref 0.1–1.0)
Monocytes Relative: 8.2 % (ref 3.0–12.0)
Neutro Abs: 3.8 10*3/uL (ref 1.4–7.7)
Neutrophils Relative %: 67.1 % (ref 43.0–77.0)
Platelets: 196 10*3/uL (ref 150.0–400.0)
RBC: 4.54 Mil/uL (ref 4.22–5.81)
RDW: 14.4 % (ref 11.5–15.5)
WBC: 5.6 10*3/uL (ref 4.0–10.5)

## 2021-12-11 LAB — HEMOGLOBIN A1C: Hgb A1c MFr Bld: 6.3 % (ref 4.6–6.5)

## 2021-12-11 NOTE — Progress Notes (Signed)
Subjective:  Patient ID: Jose Mclaughlin, male    DOB: 1980-09-09  Age: 41 y.o. MRN: [id number]  CC: Hypertension   HPI Leib Elahi presents for f/up -  He does not monitor his blood pressure.  He tells me that indapamide has not reduced his lower extremity edema.  He denies chest pain, shortness of breath, dizziness, or lightheadedness.  Outpatient Medications Prior to Visit  Medication Sig Dispense Refill   indapamide (LOZOL) 2.5 MG tablet Take 1 tablet (2.5 mg total) by mouth daily. 90 tablet 1   olmesartan (BENICAR) 40 MG tablet Take 1 tablet (40 mg total) by mouth daily. 90 tablet 1   diphenhydrAMINE (SOMINEX) 25 MG tablet Take 25 mg by mouth at bedtime as needed for allergies or sleep.     predniSONE (DELTASONE) 20 MG tablet Take 2 tablets (40 mg total) by mouth daily with breakfast. 10 tablet 0   No facility-administered medications prior to visit.    ROS Review of Systems  Constitutional: Negative.  Negative for diaphoresis and fatigue.  HENT: Negative.    Respiratory:  Positive for apnea. Negative for shortness of breath and wheezing.   Cardiovascular:  Negative for chest pain, palpitations and leg swelling.  Gastrointestinal:  Negative for abdominal pain and diarrhea.  Endocrine: Negative.   Genitourinary: Negative.  Negative for difficulty urinating.  Musculoskeletal: Negative.   Skin: Negative.   Neurological: Negative.  Negative for dizziness and weakness.  Hematological:  Negative for adenopathy. Does not bruise/bleed easily.  Psychiatric/Behavioral: Negative.      Objective:  BP 120/74 (BP Location: Left Arm, Patient Position: Sitting, Cuff Size: Large)   Pulse 87   Temp 98.4 F (36.9 C) (Oral)   Ht 6\' 3"  (1.905 m)   Wt (!) 528 lb (239.5 kg)   SpO2 91%   BMI 66.00 kg/m   BP Readings from Last 3 Encounters:  12/11/21 120/74  07/24/21 136/72  07/06/21 128/74    Wt Readings from Last 3 Encounters:  12/11/21 (!) 528 lb (239.5 kg)  07/24/21 (!) 530  lb 6.4 oz (240.6 kg)  07/06/21 (!) 533 lb (241.8 kg)    Physical Exam HENT:     Mouth/Throat:     Mouth: Mucous membranes are moist.  Eyes:     General: No scleral icterus.    Conjunctiva/sclera: Conjunctivae normal.  Cardiovascular:     Rate and Rhythm: Normal rate and regular rhythm.     Pulses: Normal pulses.     Heart sounds: No murmur heard.    No friction rub. No gallop.  Pulmonary:     Effort: Pulmonary effort is normal.     Breath sounds: No stridor. No wheezing, rhonchi or rales.  Abdominal:     General: Abdomen is protuberant. Bowel sounds are normal. There is no distension.     Palpations: Abdomen is soft. There is no hepatomegaly, splenomegaly or mass.     Tenderness: There is no abdominal tenderness. There is no guarding.  Musculoskeletal:        General: Normal range of motion.     Cervical back: Neck supple.     Right lower leg: No edema.     Left lower leg: No edema.     Comments: Elephantiasis BLE  Lymphadenopathy:     Cervical: No cervical adenopathy.  Skin:    General: Skin is warm and dry.  Neurological:     General: No focal deficit present.     Mental Status: He is alert.  Psychiatric:  Mood and Affect: Mood normal.        Behavior: Behavior normal.     Lab Results  Component Value Date   WBC 5.6 12/11/2021   HGB 13.9 12/11/2021   HCT 40.7 12/11/2021   PLT 196.0 12/11/2021   GLUCOSE 105 (H) 12/11/2021   CHOL 129 06/08/2021   TRIG 50.0 06/08/2021   HDL 46.60 06/08/2021   LDLCALC 72 06/08/2021   ALT 16 06/08/2021   AST 15 06/08/2021   NA 137 12/11/2021   K 3.9 12/11/2021   CL 101 12/11/2021   CREATININE 0.87 12/11/2021   BUN 17 12/11/2021   CO2 29 12/11/2021   TSH 1.12 06/08/2021   HGBA1C 6.3 12/11/2021    No results found.  Assessment & Plan:   [name] was seen today for hypertension.  Diagnoses and all orders for this visit:  Prediabetes- His A1c is 6.3%. -     Basic metabolic panel; Future -     Hemoglobin A1c;  Future -     Hemoglobin A1c -     Basic metabolic panel  Primary hypertension- His blood pressure is overcontrolled.  Will continue the ARB and discontinue the thiazide diuretic. -     Basic metabolic panel; Future -     CBC with Differential/Platelet; Future -     CBC with Differential/Platelet -     Basic metabolic panel -     olmesartan (BENICAR) 40 MG tablet; Take 1 tablet (40 mg total) by mouth daily.   I have discontinued Amin Bissette's diphenhydrAMINE, indapamide, and predniSONE. I am also having him maintain his olmesartan.  Meds ordered this encounter  Medications   olmesartan (BENICAR) 40 MG tablet    Sig: Take 1 tablet (40 mg total) by mouth daily.    Dispense:  90 tablet    Refill:  1     Follow-up: No follow-ups on file.  Sanda Linger, MD

## 2021-12-12 MED ORDER — OLMESARTAN MEDOXOMIL 40 MG PO TABS: 40.0000 mg | ORAL_TABLET | Freq: Every day | ORAL | 1 refills | Status: DC

## 2021-12-14 DIAGNOSIS — E113291 Type 2 diabetes mellitus with mild nonproliferative diabetic retinopathy without macular edema, right eye: Secondary | ICD-10-CM | POA: Insufficient documentation

## 2021-12-19 IMAGING — DX DG CHEST 2V
3 series · 3 of 3 positions shown · non-contrast
Comparison: April 25, 2015

CLINICAL DATA: Cough

EXAM:
CHEST - 2 VIEW

[chest pa]
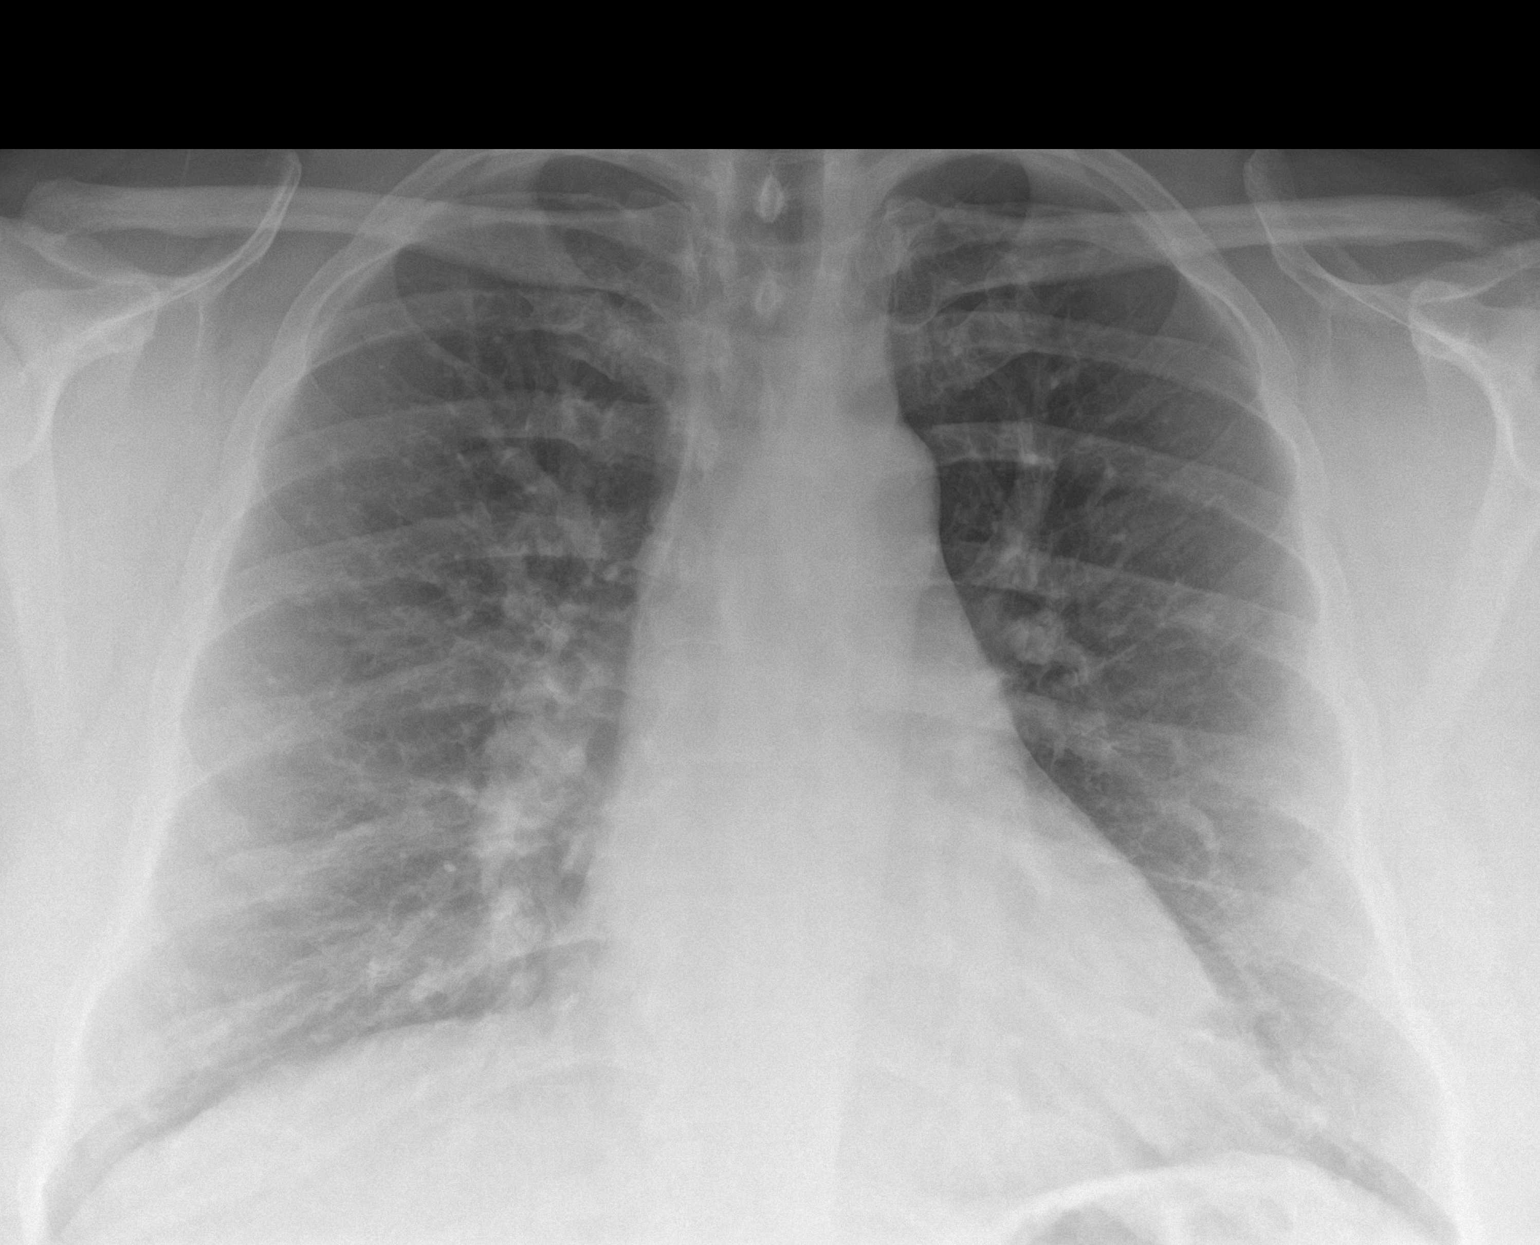

[chest lat (1 of 2)]
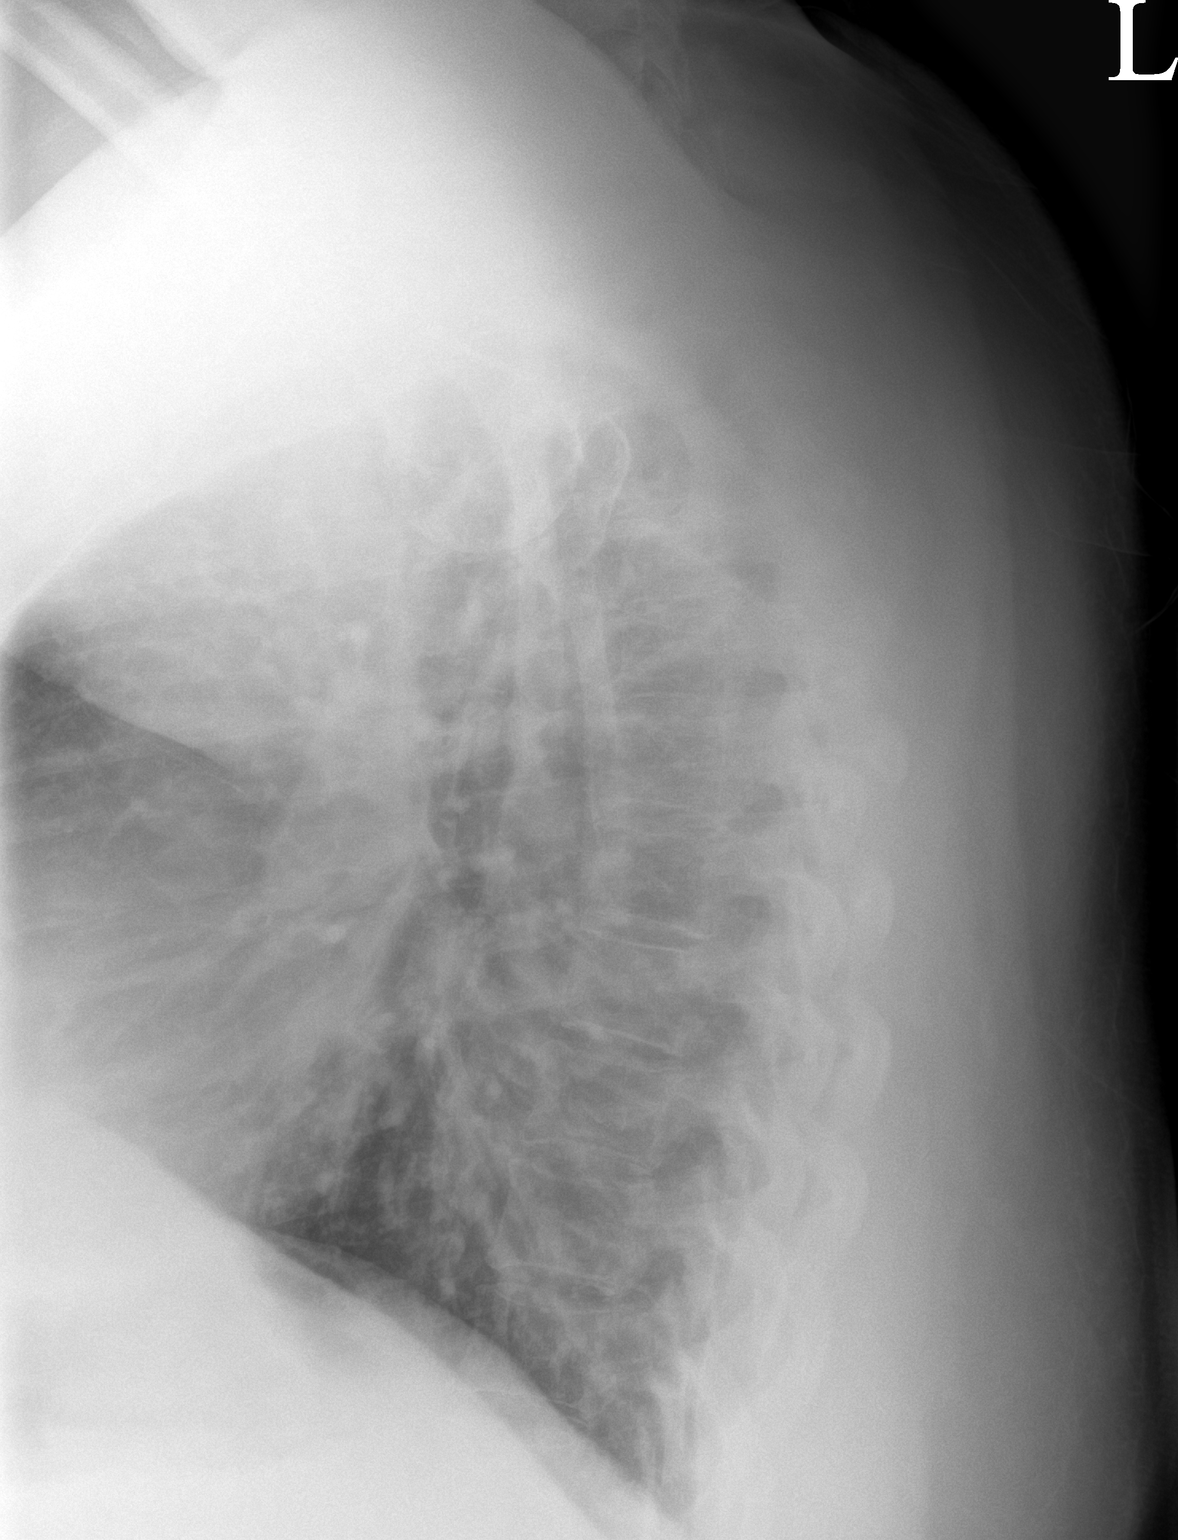

[chest lat (2 of 2)]
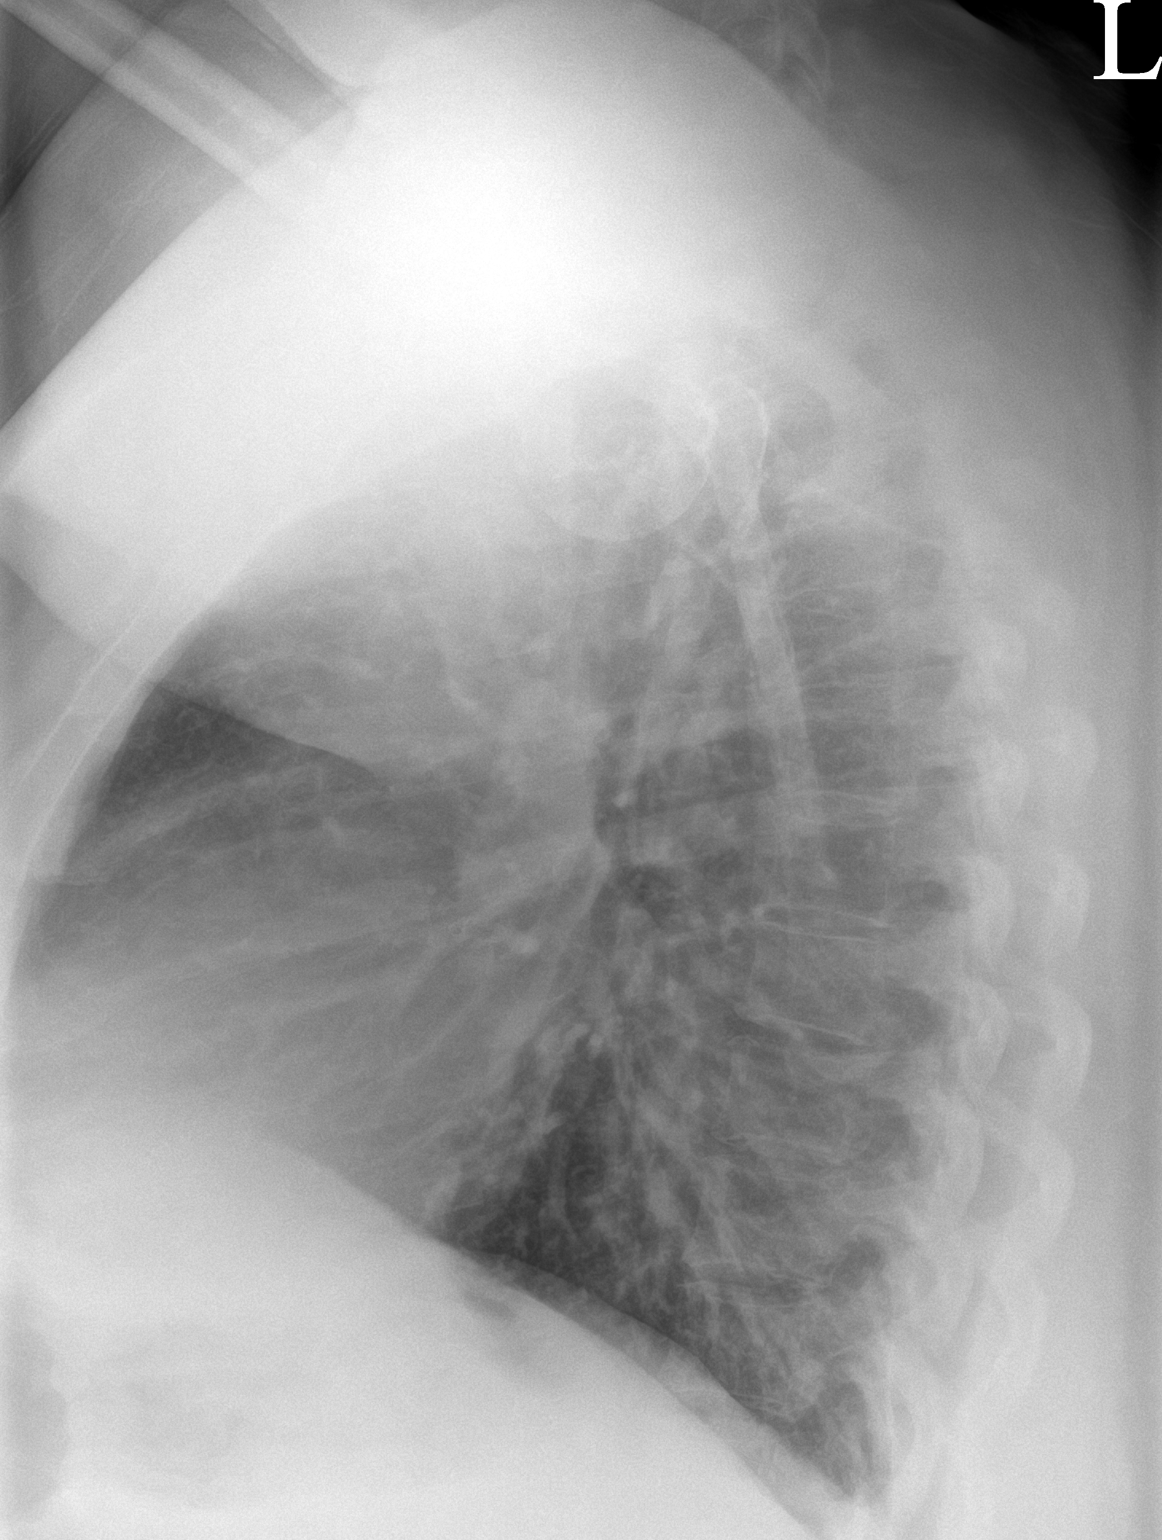

[3 of 3 positions shown; findings below may reference images not displayed]

FINDINGS: Lungs are clear. Heart size and pulmonary vascularity are normal. No
adenopathy. No bone lesions.
IMPRESSION: Lungs clear.  Heart size normal.

## 2021-12-20 ENCOUNTER — Ambulatory Visit: Payer: Commercial Managed Care - HMO | Admitting: Neurology

## 2021-12-25 ENCOUNTER — Encounter: Payer: Self-pay | Admitting: Internal Medicine

## 2022-01-17 ENCOUNTER — Ambulatory Visit: Payer: Commercial Managed Care - HMO | Admitting: Neurology

## 2022-06-12 ENCOUNTER — Ambulatory Visit (INDEPENDENT_AMBULATORY_CARE_PROVIDER_SITE_OTHER): Payer: Commercial Managed Care - HMO | Admitting: Internal Medicine

## 2022-06-12 ENCOUNTER — Encounter: Payer: Self-pay | Admitting: Internal Medicine

## 2022-06-12 VITALS — BP 158/86 | HR 83 | Temp 98.0°F | Resp 16 | Ht 75.0 in | Wt >= 6400 oz

## 2022-06-12 DIAGNOSIS — Z0001 Encounter for general adult medical examination with abnormal findings: Secondary | ICD-10-CM

## 2022-06-12 DIAGNOSIS — R9431 Abnormal electrocardiogram [ECG] [EKG]: Secondary | ICD-10-CM | POA: Insufficient documentation

## 2022-06-12 DIAGNOSIS — I10 Essential (primary) hypertension: Secondary | ICD-10-CM | POA: Diagnosis not present

## 2022-06-12 DIAGNOSIS — Z6841 Body Mass Index (BMI) 40.0 and over, adult: Secondary | ICD-10-CM

## 2022-06-12 DIAGNOSIS — L03116 Cellulitis of left lower limb: Secondary | ICD-10-CM | POA: Diagnosis not present

## 2022-06-12 DIAGNOSIS — R2242 Localized swelling, mass and lump, left lower limb: Secondary | ICD-10-CM | POA: Diagnosis not present

## 2022-06-12 DIAGNOSIS — R7303 Prediabetes: Secondary | ICD-10-CM

## 2022-06-12 DIAGNOSIS — I739 Peripheral vascular disease, unspecified: Secondary | ICD-10-CM | POA: Insufficient documentation

## 2022-06-12 LAB — URINALYSIS, ROUTINE W REFLEX MICROSCOPIC
Bilirubin Urine: NEGATIVE
Hgb urine dipstick: NEGATIVE
Ketones, ur: NEGATIVE
Leukocytes,Ua: NEGATIVE
Nitrite: NEGATIVE
RBC / HPF: NONE SEEN (ref 0–?)
Specific Gravity, Urine: 1.025 (ref 1.000–1.030)
Total Protein, Urine: NEGATIVE
Urine Glucose: NEGATIVE
Urobilinogen, UA: 1 (ref 0.0–1.0)
pH: 6.5 (ref 5.0–8.0)

## 2022-06-12 LAB — CBC WITH DIFFERENTIAL/PLATELET
Basophils Absolute: 0 10*3/uL (ref 0.0–0.1)
Basophils Relative: 0.8 % (ref 0.0–3.0)
Eosinophils Absolute: 0.1 10*3/uL (ref 0.0–0.7)
Eosinophils Relative: 1.1 % (ref 0.0–5.0)
HCT: 43.5 % (ref 39.0–52.0)
Hemoglobin: 14 g/dL (ref 13.0–17.0)
Lymphocytes Relative: 24.6 % (ref 12.0–46.0)
Lymphs Abs: 1.3 10*3/uL (ref 0.7–4.0)
MCHC: 32.2 g/dL (ref 30.0–36.0)
MCV: 89.7 fl (ref 78.0–100.0)
Monocytes Absolute: 0.4 10*3/uL (ref 0.1–1.0)
Monocytes Relative: 7.2 % (ref 3.0–12.0)
Neutro Abs: 3.4 10*3/uL (ref 1.4–7.7)
Neutrophils Relative %: 66.3 % (ref 43.0–77.0)
Platelets: 188 10*3/uL (ref 150.0–400.0)
RBC: 4.85 Mil/uL (ref 4.22–5.81)
RDW: 14.9 % (ref 11.5–15.5)
WBC: 5.2 10*3/uL (ref 4.0–10.5)

## 2022-06-12 LAB — BASIC METABOLIC PANEL
BUN: 11 mg/dL (ref 6–23)
CO2: 29 mEq/L (ref 19–32)
Calcium: 8.6 mg/dL (ref 8.4–10.5)
Chloride: 102 mEq/L (ref 96–112)
Creatinine, Ser: 0.72 mg/dL (ref 0.40–1.50)
GFR: 114.25 mL/min (ref >60.00)
Glucose, Bld: 96 mg/dL (ref 70–99)
Potassium: 4.1 mEq/L (ref 3.5–5.1)
Sodium: 137 mEq/L (ref 135–145)

## 2022-06-12 LAB — HEPATIC FUNCTION PANEL
ALT: 16 U/L (ref 0–53)
AST: 17 U/L (ref 0–37)
Albumin: 3.8 g/dL (ref 3.5–5.2)
Alkaline Phosphatase: 61 U/L (ref 39–117)
Bilirubin, Direct: 0.2 mg/dL (ref 0.0–0.3)
Total Bilirubin: 0.6 mg/dL (ref 0.2–1.2)
Total Protein: 7.6 g/dL (ref 6.0–8.3)

## 2022-06-12 LAB — C-REACTIVE PROTEIN: CRP: 1.8 mg/dL (ref 0.5–20.0)

## 2022-06-12 LAB — CK: Total CK: 61 U/L (ref 7–232)

## 2022-06-12 LAB — TSH: TSH: 1.18 u[IU]/mL (ref 0.35–5.50)

## 2022-06-12 LAB — HEMOGLOBIN A1C: Hgb A1c MFr Bld: 6 % (ref 4.6–6.5)

## 2022-06-12 MED ORDER — TIRZEPATIDE 2.5 MG/0.5ML ~~LOC~~ SOAJ
2.5000 mg | SUBCUTANEOUS | 0 refills | Status: DC
Start: 2022-06-12 — End: 2022-09-13

## 2022-06-12 MED ORDER — TRIAMTERENE-HCTZ 37.5-25 MG PO CAPS
1.0000 | ORAL_CAPSULE | Freq: Every day | ORAL | 0 refills | Status: DC
Start: 2022-06-12 — End: 2022-09-10

## 2022-06-12 NOTE — Patient Instructions (Signed)
Hypertension, Adult High blood pressure (hypertension) is when the force of blood pumping through the arteries is too strong. The arteries are the blood vessels that carry blood from the heart throughout the body. Hypertension forces the heart to work harder to pump blood and may cause arteries to become narrow or stiff. Untreated or uncontrolled hypertension can lead to a heart attack, heart failure, a stroke, kidney disease, and other problems. A blood pressure reading consists of a higher number over a lower number. Ideally, your blood pressure should be below 120/80. The first ("top") number is called the systolic pressure. It is a measure of the pressure in your arteries as your heart beats. The second ("bottom") number is called the diastolic pressure. It is a measure of the pressure in your arteries as the heart relaxes. What are the causes? The exact cause of this condition is not known. There are some conditions that result in high blood pressure. What increases the risk? Certain factors may make you more likely to develop high blood pressure. Some of these risk factors are under your control, including: Smoking. Not getting enough exercise or physical activity. Being overweight. Having too much fat, sugar, calories, or salt (sodium) in your diet. Drinking too much alcohol. Other risk factors include: Having a personal history of heart disease, diabetes, high cholesterol, or kidney disease. Stress. Having a family history of high blood pressure and high cholesterol. Having obstructive sleep apnea. Age. The risk increases with age. What are the signs or symptoms? High blood pressure may not cause symptoms. Very high blood pressure (hypertensive crisis) may cause: Headache. Fast or irregular heartbeats (palpitations). Shortness of breath. Nosebleed. Nausea and vomiting. Vision changes. Severe chest pain, dizziness, and seizures. How is this diagnosed? This condition is diagnosed by  measuring your blood pressure while you are seated, with your arm resting on a flat surface, your legs uncrossed, and your feet flat on the floor. The cuff of the blood pressure monitor will be placed directly against the skin of your upper arm at the level of your heart. Blood pressure should be measured at least twice using the same arm. Certain conditions can cause a difference in blood pressure between your right and left arms. If you have a high blood pressure reading during one visit or you have normal blood pressure with other risk factors, you may be asked to: Return on a different day to have your blood pressure checked again. Monitor your blood pressure at home for 1 week or longer. If you are diagnosed with hypertension, you may have other blood or imaging tests to help your health care provider understand your overall risk for other conditions. How is this treated? This condition is treated by making healthy lifestyle changes, such as eating healthy foods, exercising more, and reducing your alcohol intake. You may be referred for counseling on a healthy diet and physical activity. Your health care provider may prescribe medicine if lifestyle changes are not enough to get your blood pressure under control and if: Your systolic blood pressure is above 130. Your diastolic blood pressure is above 80. Your personal target blood pressure may vary depending on your medical conditions, your age, and other factors. Follow these instructions at home: Eating and drinking  Eat a diet that is high in fiber and potassium, and low in sodium, added sugar, and fat. An example of this eating plan is called the DASH diet. DASH stands for Dietary Approaches to Stop Hypertension. To eat this way: Eat   plenty of fresh fruits and vegetables. Try to fill one half of your plate at each meal with fruits and vegetables. Eat whole grains, such as whole-wheat pasta, brown rice, or whole-grain bread. Fill about one  fourth of your plate with whole grains. Eat or drink low-fat dairy products, such as skim milk or low-fat yogurt. Avoid fatty cuts of meat, processed or cured meats, and poultry with skin. Fill about one fourth of your plate with lean proteins, such as fish, chicken without skin, beans, eggs, or tofu. Avoid pre-made and processed foods. These tend to be higher in sodium, added sugar, and fat. Reduce your daily sodium intake. Many people with hypertension should eat less than 1,500 mg of sodium a day. Do not drink alcohol if: Your health care provider tells you not to drink. You are pregnant, may be pregnant, or are planning to become pregnant. If you drink alcohol: Limit how much you have to: 0-1 drink a day for women. 0-2 drinks a day for men. Know how much alcohol is in your drink. In the [place] one drink equals one 12 oz bottle of beer (355 mL), one 5 oz glass of wine (148 mL), or one 1 oz glass of hard liquor (44 mL). Lifestyle  Work with your health care provider to maintain a healthy body weight or to lose weight. Ask what an ideal weight is for you. Get at least 30 minutes of exercise that causes your heart to beat faster (aerobic exercise) most days of the week. Activities may include walking, swimming, or biking. Include exercise to strengthen your muscles (resistance exercise), such as Pilates or lifting weights, as part of your weekly exercise routine. Try to do these types of exercises for 30 minutes at least 3 days a week. Do not use any products that contain nicotine or tobacco. These products include cigarettes, chewing tobacco, and vaping devices, such as e-cigarettes. If you need help quitting, ask your health care provider. Monitor your blood pressure at home as told by your health care provider. Keep all follow-up visits. This is important. Medicines Take over-the-counter and prescription medicines only as told by your health care provider. Follow directions carefully. Blood  pressure medicines must be taken as prescribed. Do not skip doses of blood pressure medicine. Doing this puts you at risk for problems and can make the medicine less effective. Ask your health care provider about side effects or reactions to medicines that you should watch for. Contact a health care provider if you: Think you are having a reaction to a medicine you are taking. Have headaches that keep coming back (recurring). Feel dizzy. Have swelling in your ankles. Have trouble with your vision. Get help right away if you: Develop a severe headache or confusion. Have unusual weakness or numbness. Feel faint. Have severe pain in your chest or abdomen. Vomit repeatedly. Have trouble breathing. These symptoms may be an emergency. Get help right away. Call 911. Do not wait to see if the symptoms will go away. Do not drive yourself to the hospital. Summary Hypertension is when the force of blood pumping through your arteries is too strong. If this condition is not controlled, it may put you at risk for serious complications. Your personal target blood pressure may vary depending on your medical conditions, your age, and other factors. For most people, a normal blood pressure is less than 120/80. Hypertension is treated with lifestyle changes, medicines, or a combination of both. Lifestyle changes include losing weight, eating a healthy,   low-sodium diet, exercising more, and limiting alcohol. This information is not intended to replace advice given to you by your health care provider. Make sure you discuss any questions you have with your health care provider. Document Revised: 10/25/2020 Document Reviewed: 10/25/2020 Elsevier Patient Education  2024 [employer]  

## 2022-06-12 NOTE — Progress Notes (Signed)
Subjective:  Patient ID: Jose Mclaughlin, male    DOB: 11-18-1980  Age: 42 y.o. MRN: [id number]  CC: Annual Exam and Hypertension   HPI Jose Mclaughlin presents for a CPX and f/up ---  He walks and denies chest pain, shortness of breath, or diaphoresis.  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  He complains of claudication.  He complains that his left medial thigh has grown larger than the right side over the last few months.  He has not noticed any discomfort with this.  Outpatient Medications Prior to Visit  Medication Sig Dispense Refill   olmesartan (BENICAR) 40 MG tablet Take 1 tablet (40 mg total) by mouth daily. 90 tablet 1   No facility-administered medications prior to visit.    ROS Review of Systems  Constitutional:  Positive for unexpected weight change. Negative for appetite change, chills, diaphoresis, fatigue and fever.  HENT: Negative.    Eyes: Negative.   Respiratory: Negative.  Negative for cough, chest tightness, shortness of breath and wheezing.   Cardiovascular:  Positive for leg swelling. Negative for chest pain and palpitations.  Gastrointestinal:  Negative for abdominal pain, constipation, diarrhea, nausea and vomiting.  Endocrine: Negative.   Genitourinary: Negative.  Negative for difficulty urinating.  Musculoskeletal: Negative.   Skin: Negative.   Neurological:  Negative for dizziness, weakness and numbness.  Hematological:  Negative for adenopathy. Does not bruise/bleed easily.  Psychiatric/Behavioral: Negative.      Objective:  BP (!) 158/86 (BP Location: Left Arm, Patient Position: Sitting, Cuff Size: Large) Comment: thigh cuff  Pulse 83   Temp 98 F (36.7 C) (Oral)   Resp 16   Ht 6\' 3"  (1.905 m)   Wt (!) 539 lb (244.5 kg)   SpO2 92%   BMI 67.37 kg/m   BP Readings from Last 3 Encounters:  06/17/22 (!) 158/86  12/11/21 120/74  07/24/21 136/72    Wt Readings from Last 3 Encounters:  06/12/22 (!)  539 lb (244.5 kg)  12/11/21 (!) 528 lb (239.5 kg)  07/24/21 (!) 530 lb 6.4 oz (240.6 kg)    Physical Exam Constitutional:      General: He is not in acute distress.    Appearance: He is obese. He is ill-appearing. He is not toxic-appearing or diaphoretic.  HENT:     Nose: Nose normal.     Mouth/Throat:     Mouth: Mucous membranes are moist.  Eyes:     General: No scleral icterus.    Conjunctiva/sclera: Conjunctivae normal.  Cardiovascular:     Rate and Rhythm: Normal rate and regular rhythm.     Pulses:          Dorsalis pedis pulses are 0 on the right side and 0 on the left side.       Posterior tibial pulses are 0 on the right side and 0 on the left side.     Heart sounds: Normal heart sounds, S1 normal and S2 normal. No murmur heard.    No friction rub. No gallop.  Pulmonary:     Effort: Pulmonary effort is normal.     Breath sounds: No stridor. No wheezing, rhonchi or rales.  Abdominal:     General: Abdomen is protuberant. Bowel sounds are normal. There is no distension.     Palpations: Abdomen is soft. There is no hepatomegaly, splenomegaly or mass.     Tenderness: There is no abdominal tenderness. There is no guarding.  Musculoskeletal:  Cervical back: Neck supple.     Right lower leg: No edema.     Left lower leg: No edema.     Comments: There is elephantiasis in both lower extremities but there is no pitting edema.  Feet:     Right foot:     Skin integrity: Skin integrity normal.     Toenail Condition: Right toenails are normal.     Left foot:     Skin integrity: Skin integrity normal.     Toenail Condition: Left toenails are normal.     Comments: EKG- NSR, 71 bpm LAD, RBBB - new TWI in V1 V2 are new Skin:    General: Skin is warm and dry.     Comments: The skin over the left medial thigh is mildly, diffusely erythematous.  There is hypertrophy of the left subcutaneous area compared to the right.  There is no induration, fluctuance, wounds, streaking, or  exudates.  Neurological:     General: No focal deficit present.     Mental Status: He is alert. Mental status is at baseline.  Psychiatric:        Mood and Affect: Mood normal.        Behavior: Behavior normal.     Lab Results  Component Value Date   WBC 5.2 06/12/2022   HGB 14.0 06/12/2022   HCT 43.5 06/12/2022   PLT 188.0 06/12/2022   GLUCOSE 96 06/12/2022   CHOL 129 06/08/2021   TRIG 50.0 06/08/2021   HDL 46.60 06/08/2021   LDLCALC 72 06/08/2021   ALT 16 06/12/2022   AST 17 06/12/2022   NA 137 06/12/2022   K 4.1 06/12/2022   CL 102 06/12/2022   CREATININE 0.72 06/12/2022   BUN 11 06/12/2022   CO2 29 06/12/2022   TSH 1.18 06/12/2022   HGBA1C 6.0 06/12/2022    No results found.  Assessment & Plan:   Prediabetes- Will start tirzepatide. -     Basic metabolic panel; Future -     Hemoglobin A1c; Future  Primary hypertension- His blood pressure is not at goal.  Will add Dyazide to the ARB. -     Basic metabolic panel; Future -     CBC with Differential/Platelet; Future -     TSH; Future -     Urinalysis, Routine w reflex microscopic; Future -     Hepatic function panel; Future -     Triamterene-HCTZ; Take 1 each (1 capsule total) by mouth daily.  Dispense: 90 capsule; Refill: 0  Morbid obesity with BMI of 60.0-69.9, adult (HCC)- Will start tirzepatide. -     Basic metabolic panel; Future -     TSH; Future -     Hepatic function panel; Future -     Hemoglobin A1c; Future -     Tirzepatide; Inject 2.5 mg into the skin once a week.  Dispense: 2 mL; Refill: 0  Encounter for general adult medical examination with abnormal findings- Exam completed, labs reviewed, vaccines reviewed, no cancer screenings indicated, patient education was given.  Claudication of both lower extremities (HCC) -     Ambulatory referral to Vascular Surgery  Mass of left thigh - Labs are normal. Will treat for cellulitis. -     C-reactive protein; Future -     CK; Future  Abnormal  electrocardiogram (ECG) (EKG) -     ECHOCARDIOGRAM COMPLETE; Future  Cellulitis of left thigh -     Amoxicillin-Pot Clavulanate; Take 1 tablet by mouth 2 (two) times  daily for 10 days.  Dispense: 20 tablet; Refill: 0     Follow-up: Return in about 3 months (around 09/12/2022).  Sanda Linger, MD

## 2022-06-13 DIAGNOSIS — L03116 Cellulitis of left lower limb: Secondary | ICD-10-CM | POA: Insufficient documentation

## 2022-06-13 MED ORDER — AMOXICILLIN-POT CLAVULANATE 875-125 MG PO TABS
1.0000 | ORAL_TABLET | Freq: Two times a day (BID) | ORAL | 0 refills | Status: AC
Start: 2022-06-13 — End: 2022-06-23

## 2022-06-14 ENCOUNTER — Other Ambulatory Visit: Payer: Self-pay | Admitting: Internal Medicine

## 2022-06-14 DIAGNOSIS — I10 Essential (primary) hypertension: Secondary | ICD-10-CM

## 2022-06-18 NOTE — Addendum Note (Signed)
Addended by: Darryll Capers on: 06/18/2022 08:04 AM   Modules accepted: Orders

## 2022-06-28 ENCOUNTER — Encounter: Payer: Self-pay | Admitting: Internal Medicine

## 2022-06-29 ENCOUNTER — Other Ambulatory Visit: Payer: Self-pay | Admitting: Internal Medicine

## 2022-06-29 DIAGNOSIS — R2242 Localized swelling, mass and lump, left lower limb: Secondary | ICD-10-CM

## 2022-07-03 ENCOUNTER — Ambulatory Visit (HOSPITAL_COMMUNITY)
Admission: RE | Admit: 2022-07-03 | Discharge: 2022-07-03 | Disposition: A | Discharge status: Home / Self Care | Payer: Commercial Managed Care - HMO | Source: Ambulatory Visit | Attending: Internal Medicine | Admitting: Internal Medicine

## 2022-07-03 DIAGNOSIS — R9431 Abnormal electrocardiogram [ECG] [EKG]: Secondary | ICD-10-CM | POA: Insufficient documentation

## 2022-07-03 DIAGNOSIS — I081 Rheumatic disorders of both mitral and tricuspid valves: Secondary | ICD-10-CM | POA: Diagnosis not present

## 2022-07-03 LAB — ECHOCARDIOGRAM COMPLETE
Area-P 1/2: 3.08 cm2
Calc EF: 59 %
S' Lateral: 4.4 cm
Single Plane A2C EF: 55.9 %
Single Plane A4C EF: 61.3 %

## 2022-07-03 NOTE — Progress Notes (Signed)
Echocardiogram 2D Echocardiogram has been performed.  Augustine Radar 07/03/2022, 12:03 PM

## 2022-07-06 ENCOUNTER — Other Ambulatory Visit: Payer: Self-pay | Admitting: *Deleted

## 2022-07-06 DIAGNOSIS — I739 Peripheral vascular disease, unspecified: Secondary | ICD-10-CM

## 2022-07-16 ENCOUNTER — Encounter: Payer: Self-pay | Admitting: Internal Medicine

## 2022-07-19 ENCOUNTER — Encounter: Payer: Self-pay | Admitting: Vascular Surgery

## 2022-07-19 ENCOUNTER — Ambulatory Visit (HOSPITAL_COMMUNITY)
Admission: RE | Admit: 2022-07-19 | Discharge: 2022-07-19 | Disposition: A | Discharge status: Home / Self Care | Payer: Commercial Managed Care - HMO | Source: Ambulatory Visit | Attending: Vascular Surgery | Admitting: Vascular Surgery

## 2022-07-19 ENCOUNTER — Ambulatory Visit: Payer: Commercial Managed Care - HMO | Admitting: Vascular Surgery

## 2022-07-19 VITALS — BP 124/84 | HR 67 | Temp 97.0°F | Resp 18 | Ht 73.25 in | Wt >= 6400 oz

## 2022-07-19 DIAGNOSIS — I739 Peripheral vascular disease, unspecified: Secondary | ICD-10-CM | POA: Insufficient documentation

## 2022-07-19 DIAGNOSIS — I890 Lymphedema, not elsewhere classified: Secondary | ICD-10-CM

## 2022-07-19 LAB — VAS US ABI WITH/WO TBI
Left ABI: 0.93
Right ABI: 1.07

## 2022-07-19 NOTE — Progress Notes (Signed)
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  He has hyperkeratosis and his swelling does limit his mobility somewhat.  We have discussed conservative measures.  I encouraged him to avoid prolonged sitting and standing.  We discussed importance of exercise specifically walking and water aerobics.  I encouraged him to wear his compression stockings which she does have.  We also discussed the importance of daily leg elevation and the proper positioning for this.  We also discussed the importance of maintaining a healthy weight as central obesity especially increases lymphatic obstruction.  I like to see him back in 4 weeks.  If the swelling has not improved significantly with these conservative measures I think he would be a candidate for a half leg BioTAB bilateral lymphedema pump which could be used in conjunction with proper leg elevation.  We will see him back in 4 weeks.  Of note I reassured him that he has no evidence of peripheral arterial disease.  Based on his exam he does not have evidence of advanced venous disease.  REASON FOR CONSULT:    Claudication.  The consult is requested by Dr. Sanda Linger  HPI:   Jose Mclaughlin is a 42 y.o. male who is referred with according to the patient, bilateral lower extremity swelling.  He has had this for many years but over the last 2 years this is gotten progressively worse.  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  His swelling is best when he wakes up in the morning but gradually worsens during the day.  He does not have significant pain associated with this.  He denies any history of claudication, rest pain, or nonhealing ulcers.  He does not describe lymphorrhea.  Past Medical History:  Diagnosis Date   Hypertension    Leg fracture, left     Family History  Adopted: Yes  Problem Relation Age of Onset   Sleep apnea Neg Hx     SOCIAL HISTORY: Social History    Tobacco Use   Smoking status: Former    Current packs/day: 0.00    Average packs/day: 0.5 packs/day for 23.9 years (12.0 ttl pk-yrs)    Types: Cigarettes    Start date: 03/29/1997    Quit date: 03/02/2021    Years since quitting: 1.3   Smokeless tobacco: Never  Substance Use Topics   Alcohol use: Yes    Alcohol/week: 5.0 standard drinks of alcohol    Types: 5 Cans of beer per week    No Known Allergies  Current Outpatient Medications  Medication Sig Dispense Refill   olmesartan (BENICAR) 40 MG tablet TAKE 1 TABLET(40 MG) BY MOUTH DAILY 90 tablet 1   triamterene-hydrochlorothiazide (DYAZIDE) 37.5-25 MG capsule Take 1 each (1 capsule total) by mouth daily. 90 capsule 0   tirzepatide (MOUNJARO) 2.5 MG/0.5ML Pen Inject 2.5 mg into the skin once a week. (Patient not taking: Reported on 07/19/2022) 2 mL 0   No current facility-administered medications for this visit.    REVIEW OF SYSTEMS:  [X]  denotes positive finding, [ ]  denotes negative finding Cardiac  Comments:  Chest pain or chest pressure:    Shortness of breath upon exertion:    Short of breath when lying flat:    Irregular heart rhythm:        Vascular    Pain in calf, thigh, or hip brought on by ambulation:    Pain in feet at night  that wakes you up from your sleep:     Blood clot in your veins:    Leg swelling:  x       Pulmonary    Oxygen at home:    Productive cough:     Wheezing:         Neurologic    Sudden weakness in arms or legs:     Sudden numbness in arms or legs:     Sudden onset of difficulty speaking or slurred speech:    Temporary loss of vision in one eye:     Problems with dizziness:         Gastrointestinal    Blood in stool:     Vomited blood:         Genitourinary    Burning when urinating:     Blood in urine:        Psychiatric    Major depression:         Hematologic    Bleeding problems:    Problems with blood clotting too easily:        Skin    Rashes or ulcers:         Constitutional    Fever or chills:    -  PHYSICAL EXAM:   Vitals:   07/19/22 0826  BP: 124/84  Pulse: 67  Resp: 18  Temp: (!) 97 F (36.1 C)  TempSrc: Temporal  SpO2: 94%  Weight: (!) 521 lb 12.8 oz (236.7 kg)  Height: 6' 1.25" (1.861 m)   Body mass index is 68.37 kg/m.  GENERAL: The patient is a well-nourished male, in no acute distress. The vital signs are documented above. CARDIAC: There is a regular rate and rhythm.  VASCULAR: I do not detect carotid bruits. I cannot palpate pedal pulses because of his leg swelling however he has triphasic Doppler signals in both feet. He has severe lymphedema bilaterally with hyperkeratosis. The swelling is more significant on the right side.  PULMONARY: There is good air exchange bilaterally without wheezing or rales. ABDOMEN: Soft and non-tender with normal pitched bowel sounds.  MUSCULOSKELETAL: There are no major deformities. NEUROLOGIC: No focal weakness or paresthesias are detected. SKIN: There are no ulcers or rashes noted. PSYCHIATRIC: The patient has a normal affect.  DATA:    ARTERIAL Doppler study: I have independently interpreted his arterial Doppler study today.  On the right side there is a triphasic posterior tibial and dorsalis pedis signal.  ABIs 100%.  Toe pressures 140 mmHg.  On the left side there is a triphasic posterior tibial and dorsalis pedis signal.  ABIs 91%.  Toe pressures 134 mmHg.   Jose Mclaughlin [facility]

## 2022-07-26 ENCOUNTER — Other Ambulatory Visit: Payer: Commercial Managed Care - HMO

## 2022-08-16 ENCOUNTER — Encounter (INDEPENDENT_AMBULATORY_CARE_PROVIDER_SITE_OTHER): Payer: Self-pay

## 2022-08-23 ENCOUNTER — Ambulatory Visit: Payer: Commercial Managed Care - HMO | Admitting: Physician Assistant

## 2022-08-23 VITALS — BP 120/79 | HR 72 | Temp 97.8°F | Resp 22 | Ht 73.25 in | Wt >= 6400 oz

## 2022-08-23 DIAGNOSIS — I890 Lymphedema, not elsewhere classified: Secondary | ICD-10-CM | POA: Diagnosis not present

## 2022-08-23 NOTE — Progress Notes (Signed)
Office Note  History of Present Illness   Jose Mclaughlin is a 42 y.o. (11-May-1980) male who presents for repeat evaluation of bilateral lower extremity lymphedema.  He was first evaluated by Dr. Edilia Bo on 7/18 with fairly advanced lymphedema.  The patient has been dealing with his lower extremity swelling for several years, with progressive worsening over the past 2 years.  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  He has skin changes including hyperpigmentation and hyperkeratosis.  His swelling does limit his mobility somewhat.  He was encouraged to pursue conservative measures to treat his leg swelling, including avoiding prolonged sitting and standing, increasing in exercise, elevating his legs, and wearing compression stockings.  He returns today for repeat evaluation.  He has tried to elevate his legs more at work when he can.  He has been exercising and trying to lose weight.  He has been wearing compression stockings.  He has felt very little benefit from these measures.  Past Medical History:  Diagnosis Date   Hypertension    Leg fracture, left     History reviewed. No pertinent surgical history.  Social History   Socioeconomic History   Marital status: Single    Spouse name: Not on file   Number of children: Not on file   Years of education: Not on file   Highest education level: Bachelor's degree (e.g., BA, AB, BS)  Occupational History   Not on file  Tobacco Use   Smoking status: Former    Current packs/day: 0.00    Average packs/day: 0.5 packs/day for 23.9 years (12.0 ttl pk-yrs)    Types: Cigarettes    Start date: 03/29/1997    Quit date: 03/02/2021    Years since quitting: 1.4   Smokeless tobacco: Never  Substance and Sexual Activity   Alcohol use: Yes    Alcohol/week: 5.0 standard drinks of alcohol    Types: 5 Cans of beer per week   Drug use: No   Sexual activity: Not Currently    Partners: Female  Other Topics Concern    Not on file  Social History Narrative   Not on file   Social Determinants of Health   Financial Resource Strain: Low Risk  (06/11/2022)   Overall Financial Resource Strain (CARDIA)    Difficulty of Paying Living Expenses: Not very hard  Food Insecurity: No Food Insecurity (06/11/2022)   Hunger Vital Sign    Worried About Running Out of Food in the Last Year: Never true    Ran Out of Food in the Last Year: Never true  Transportation Needs: No Transportation Needs (06/11/2022)   PRAPARE - Administrator, Civil Service (Medical): No    Lack of Transportation (Non-Medical): No  Physical Activity: Insufficiently Active (06/11/2022)   Exercise Vital Sign    Days of Exercise per Week: 1 day    Minutes of Exercise per Session: 10 min  Stress: No Stress Concern Present (06/11/2022)   [employer] of Occupational Health - Occupational Stress Questionnaire    Feeling of Stress : Only a little  Social Connections: Unknown (06/11/2022)   Social Connection and Isolation Panel [NHANES]    Frequency of Communication with Friends and Family: Once a week    Frequency of Social Gatherings with Friends and Family: Three times a week    Attends Religious Services: More than 4 times per year  Active Member of Clubs or Organizations: Patient declined    Attends Banker Meetings: Not on file    Marital Status: Never married  Intimate Partner Violence: Not on file    Family History  Adopted: Yes  Problem Relation Age of Onset   Sleep apnea Neg Hx     Current Outpatient Medications  Medication Sig Dispense Refill   olmesartan (BENICAR) 40 MG tablet TAKE 1 TABLET(40 MG) BY MOUTH DAILY 90 tablet 1   triamterene-hydrochlorothiazide (DYAZIDE) 37.5-25 MG capsule Take 1 each (1 capsule total) by mouth daily. 90 capsule 0   tirzepatide (MOUNJARO) 2.5 MG/0.5ML Pen Inject 2.5 mg into the skin once a week. (Patient not taking: Reported on 07/19/2022) 2 mL 0   No current  facility-administered medications for this visit.    No Known Allergies  REVIEW OF SYSTEMS (negative unless checked):   Cardiac:  []  Chest pain or chest pressure? []  Shortness of breath upon activity? []  Shortness of breath when lying flat? []  Irregular heart rhythm?  Vascular:  []  Pain in calf, thigh, or hip brought on by walking? []  Pain in feet at night that wakes you up from your sleep? []  Blood clot in your veins? [x]  Leg swelling?  Pulmonary:  []  Oxygen at home? []  Productive cough? []  Wheezing?  Neurologic:  []  Sudden weakness in arms or legs? []  Sudden numbness in arms or legs? []  Sudden onset of difficult speaking or slurred speech? []  Temporary loss of vision in one eye? []  Problems with dizziness?  Gastrointestinal:  []  Blood in stool? []  Vomited blood?  Genitourinary:  []  Burning when urinating? []  Blood in urine?  Psychiatric:  []  Major depression  Hematologic:  []  Bleeding problems? []  Problems with blood clotting?  Dermatologic:  []  Rashes or ulcers?  Constitutional:  []  Fever or chills?  Ear/Nose/Throat:  []  Change in hearing? []  Nose bleeds? []  Sore throat?  Musculoskeletal:  []  Back pain? []  Joint pain? []  Muscle pain?   Physical Examination     Vitals:   08/23/22 0853  BP: 120/79  Pulse: 72  Resp: (!) 22  Temp: 97.8 F (36.6 C)  TempSrc: Temporal  SpO2: 96%  Weight: (!) 512 lb 8 oz (232.5 kg)   Body mass index is 67.16 kg/m.  General:  WDWN in NAD; vital signs documented above Gait: Not observed HENT: WNL, normocephalic Pulmonary: normal non-labored breathing , without Rales, rhonchi,  wheezing Cardiac: regular Abdomen: soft, NT, no masses Skin: without rashes Vascular Exam/Pulses: triphasic DP doppler signals bilaterally Extremities: severe bilateral lower extremity lymphedema, R>L. Skin changes including hyperpigmentation and hyperkeratosis Musculoskeletal: no muscle wasting or atrophy  Neurologic: A&O X 3;   No focal weakness or paresthesias are detected Psychiatric:  The pt has Normal affect.  Leg Measurements (07/19/2022) Foot:30cm Ankle: 38cm Calf: 58cm Knee: 65cm  Leg Measurements (08/23/2022) Foot:30cm Ankle: 36cm Calf: 55cm Knee: 63cm    Medical Decision Making   Jose Mclaughlin is a 42 y.o. male who presents for repeat evaluation of lymphedema  The patient returns for repeat evaluation of severe bilateral lower extremity lymphedema. This has been going on for several years and has worsened in the past 2 years On exam he has severe bilateral lower extremity lymphedema, right greater than left. His skin changes include hyperpigmentation and hyperkeratosis. Given his prolonged history of lymphedema with the skin changes mentioned above, I am prescribing BioTab lymphedema pumps. He has attempted elevation, compression, and exercise over the past 4 weeks without benefit.  He can follow up with our office as needed   Ernestene Mention, PA-C [facility] Office: [phone]  08/23/2022, 9:10 AM  Clinic MD: Edilia Bo

## 2022-09-10 ENCOUNTER — Other Ambulatory Visit: Payer: Self-pay | Admitting: Internal Medicine

## 2022-09-10 DIAGNOSIS — I10 Essential (primary) hypertension: Secondary | ICD-10-CM

## 2022-09-13 ENCOUNTER — Ambulatory Visit (INDEPENDENT_AMBULATORY_CARE_PROVIDER_SITE_OTHER): Payer: Commercial Managed Care - HMO | Admitting: Internal Medicine

## 2022-09-13 ENCOUNTER — Encounter: Payer: Self-pay | Admitting: Internal Medicine

## 2022-09-13 VITALS — BP 122/78 | HR 71 | Temp 97.9°F | Ht 73.25 in | Wt >= 6400 oz

## 2022-09-13 DIAGNOSIS — I10 Essential (primary) hypertension: Secondary | ICD-10-CM

## 2022-09-13 DIAGNOSIS — R7303 Prediabetes: Secondary | ICD-10-CM

## 2022-09-13 DIAGNOSIS — Z6841 Body Mass Index (BMI) 40.0 and over, adult: Secondary | ICD-10-CM | POA: Diagnosis not present

## 2022-09-13 LAB — BASIC METABOLIC PANEL
BUN: 17 mg/dL (ref 6–23)
CO2: 28 meq/L (ref 19–32)
Calcium: 8.7 mg/dL (ref 8.4–10.5)
Chloride: 103 meq/L (ref 96–112)
Creatinine, Ser: 0.91 mg/dL (ref 0.40–1.50)
GFR: 105.21 mL/min (ref >60.00)
Glucose, Bld: 99 mg/dL (ref 70–99)
Potassium: 4 meq/L (ref 3.5–5.1)
Sodium: 139 meq/L (ref 135–145)

## 2022-09-13 MED ORDER — ZEPBOUND 2.5 MG/0.5ML ~~LOC~~ SOAJ
2.5000 mg | SUBCUTANEOUS | 0 refills | Status: DC
Start: 2022-09-13 — End: 2023-03-13

## 2022-09-13 NOTE — Patient Instructions (Signed)
Hypertension, Adult High blood pressure (hypertension) is when the force of blood pumping through the arteries is too strong. The arteries are the blood vessels that carry blood from the heart throughout the body. Hypertension forces the heart to work harder to pump blood and may cause arteries to become narrow or stiff. Untreated or uncontrolled hypertension can lead to a heart attack, heart failure, a stroke, kidney disease, and other problems. A blood pressure reading consists of a higher number over a lower number. Ideally, your blood pressure should be below 120/80. The first ("top") number is called the systolic pressure. It is a measure of the pressure in your arteries as your heart beats. The second ("bottom") number is called the diastolic pressure. It is a measure of the pressure in your arteries as the heart relaxes. What are the causes? The exact cause of this condition is not known. There are some conditions that result in high blood pressure. What increases the risk? Certain factors may make you more likely to develop high blood pressure. Some of these risk factors are under your control, including: Smoking. Not getting enough exercise or physical activity. Being overweight. Having too much fat, sugar, calories, or salt (sodium) in your diet. Drinking too much alcohol. Other risk factors include: Having a personal history of heart disease, diabetes, high cholesterol, or kidney disease. Stress. Having a family history of high blood pressure and high cholesterol. Having obstructive sleep apnea. Age. The risk increases with age. What are the signs or symptoms? High blood pressure may not cause symptoms. Very high blood pressure (hypertensive crisis) may cause: Headache. Fast or irregular heartbeats (palpitations). Shortness of breath. Nosebleed. Nausea and vomiting. Vision changes. Severe chest pain, dizziness, and seizures. How is this diagnosed? This condition is diagnosed by  measuring your blood pressure while you are seated, with your arm resting on a flat surface, your legs uncrossed, and your feet flat on the floor. The cuff of the blood pressure monitor will be placed directly against the skin of your upper arm at the level of your heart. Blood pressure should be measured at least twice using the same arm. Certain conditions can cause a difference in blood pressure between your right and left arms. If you have a high blood pressure reading during one visit or you have normal blood pressure with other risk factors, you may be asked to: Return on a different day to have your blood pressure checked again. Monitor your blood pressure at home for 1 week or longer. If you are diagnosed with hypertension, you may have other blood or imaging tests to help your health care provider understand your overall risk for other conditions. How is this treated? This condition is treated by making healthy lifestyle changes, such as eating healthy foods, exercising more, and reducing your alcohol intake. You may be referred for counseling on a healthy diet and physical activity. Your health care provider may prescribe medicine if lifestyle changes are not enough to get your blood pressure under control and if: Your systolic blood pressure is above 130. Your diastolic blood pressure is above 80. Your personal target blood pressure may vary depending on your medical conditions, your age, and other factors. Follow these instructions at home: Eating and drinking  Eat a diet that is high in fiber and potassium, and low in sodium, added sugar, and fat. An example of this eating plan is called the DASH diet. DASH stands for Dietary Approaches to Stop Hypertension. To eat this way: Eat   plenty of fresh fruits and vegetables. Try to fill one half of your plate at each meal with fruits and vegetables. Eat whole grains, such as whole-wheat pasta, brown rice, or whole-grain bread. Fill about one  fourth of your plate with whole grains. Eat or drink low-fat dairy products, such as skim milk or low-fat yogurt. Avoid fatty cuts of meat, processed or cured meats, and poultry with skin. Fill about one fourth of your plate with lean proteins, such as fish, chicken without skin, beans, eggs, or tofu. Avoid pre-made and processed foods. These tend to be higher in sodium, added sugar, and fat. Reduce your daily sodium intake. Many people with hypertension should eat less than 1,500 mg of sodium a day. Do not drink alcohol if: Your health care provider tells you not to drink. You are pregnant, may be pregnant, or are planning to become pregnant. If you drink alcohol: Limit how much you have to: 0-1 drink a day for women. 0-2 drinks a day for men. Know how much alcohol is in your drink. In the [place] one drink equals one 12 oz bottle of beer (355 mL), one 5 oz glass of wine (148 mL), or one 1 oz glass of hard liquor (44 mL). Lifestyle  Work with your health care provider to maintain a healthy body weight or to lose weight. Ask what an ideal weight is for you. Get at least 30 minutes of exercise that causes your heart to beat faster (aerobic exercise) most days of the week. Activities may include walking, swimming, or biking. Include exercise to strengthen your muscles (resistance exercise), such as Pilates or lifting weights, as part of your weekly exercise routine. Try to do these types of exercises for 30 minutes at least 3 days a week. Do not use any products that contain nicotine or tobacco. These products include cigarettes, chewing tobacco, and vaping devices, such as e-cigarettes. If you need help quitting, ask your health care provider. Monitor your blood pressure at home as told by your health care provider. Keep all follow-up visits. This is important. Medicines Take over-the-counter and prescription medicines only as told by your health care provider. Follow directions carefully. Blood  pressure medicines must be taken as prescribed. Do not skip doses of blood pressure medicine. Doing this puts you at risk for problems and can make the medicine less effective. Ask your health care provider about side effects or reactions to medicines that you should watch for. Contact a health care provider if you: Think you are having a reaction to a medicine you are taking. Have headaches that keep coming back (recurring). Feel dizzy. Have swelling in your ankles. Have trouble with your vision. Get help right away if you: Develop a severe headache or confusion. Have unusual weakness or numbness. Feel faint. Have severe pain in your chest or abdomen. Vomit repeatedly. Have trouble breathing. These symptoms may be an emergency. Get help right away. Call 911. Do not wait to see if the symptoms will go away. Do not drive yourself to the hospital. Summary Hypertension is when the force of blood pumping through your arteries is too strong. If this condition is not controlled, it may put you at risk for serious complications. Your personal target blood pressure may vary depending on your medical conditions, your age, and other factors. For most people, a normal blood pressure is less than 120/80. Hypertension is treated with lifestyle changes, medicines, or a combination of both. Lifestyle changes include losing weight, eating a healthy,   low-sodium diet, exercising more, and limiting alcohol. This information is not intended to replace advice given to you by your health care provider. Make sure you discuss any questions you have with your health care provider. Document Revised: 10/25/2020 Document Reviewed: 10/25/2020 Elsevier Patient Education  2024 [employer]  

## 2022-09-13 NOTE — Progress Notes (Unsigned)
Subjective:  Patient ID: Jose Mclaughlin, male    DOB: [birth year]  Age: 42 y.o. MRN: [id number]  CC: Hypertension   HPI Romeo Gabhart presents for f/up ----  He is active and denies DOE, CP, SOB.   Outpatient Medications Prior to Visit  Medication Sig Dispense Refill   olmesartan (BENICAR) 40 MG tablet TAKE 1 TABLET(40 MG) BY MOUTH DAILY 90 tablet 1   triamterene-hydrochlorothiazide (DYAZIDE) 37.5-25 MG capsule TAKE 1 CAPSULE EVERY DAY 90 capsule 0   tirzepatide (MOUNJARO) 2.5 MG/0.5ML Pen Inject 2.5 mg into the skin once a week. (Patient not taking: Reported on 07/19/2022) 2 mL 0   No facility-administered medications prior to visit.    ROS Review of Systems  Constitutional:  Negative for diaphoresis and fatigue.  HENT: Negative.    Eyes: Negative.   Respiratory: Negative.  Negative for chest tightness and shortness of breath.   Cardiovascular:  Positive for leg swelling. Negative for chest pain and palpitations.  Gastrointestinal:  Negative for abdominal pain, diarrhea, nausea and vomiting.  Genitourinary: Negative.  Negative for difficulty urinating.  Musculoskeletal: Negative.   Skin: Negative.   Neurological: Negative.  Negative for dizziness.  Hematological:  Negative for adenopathy. Does not bruise/bleed easily.  Psychiatric/Behavioral: Negative.      Objective:  BP 122/78 (BP Location: Right Arm, Patient Position: Sitting, Cuff Size: Large)   Pulse 71   Temp 97.9 F (36.6 C) (Oral)   Ht 6' 1.25" (1.861 m)   Wt (!) 519 lb (235.4 kg)   SpO2 90%   BMI 68.01 kg/m   BP Readings from Last 3 Encounters:  09/13/22 122/78  08/23/22 120/79  07/19/22 124/84    Wt Readings from Last 3 Encounters:  09/13/22 (!) 519 lb (235.4 kg)  08/23/22 (!) 512 lb 8 oz (232.5 kg)  07/19/22 (!) 521 lb 12.8 oz (236.7 kg)    Physical Exam Vitals reviewed.  Constitutional:      Appearance: Normal appearance.  HENT:     Mouth/Throat:     Mouth: Mucous membranes are moist.   Eyes:     General: No scleral icterus.    Conjunctiva/sclera: Conjunctivae normal.  Cardiovascular:     Rate and Rhythm: Normal rate and regular rhythm.     Heart sounds: No murmur heard.    Comments: +++ elephantiasis BLE Pulmonary:     Effort: Pulmonary effort is normal.     Breath sounds: No stridor. No wheezing, rhonchi or rales.  Abdominal:     General: Abdomen is protuberant. Bowel sounds are normal. There is no distension.     Palpations: Abdomen is soft. There is no hepatomegaly, splenomegaly or mass.  Musculoskeletal:        General: Normal range of motion.     Cervical back: Neck supple.  Lymphadenopathy:     Cervical: No cervical adenopathy.  Skin:    General: Skin is warm and dry.  Neurological:     General: No focal deficit present.     Mental Status: He is alert. Mental status is at baseline.     Lab Results  Component Value Date   WBC 5.2 06/12/2022   HGB 14.0 06/12/2022   HCT 43.5 06/12/2022   PLT 188.0 06/12/2022   GLUCOSE 99 09/13/2022   CHOL 129 06/08/2021   TRIG 50.0 06/08/2021   HDL 46.60 06/08/2021   LDLCALC 72 06/08/2021   ALT 16 06/12/2022   AST 17 06/12/2022   NA 139 09/13/2022   K 4.0 09/13/2022  CL 103 09/13/2022   CREATININE 0.91 09/13/2022   BUN 17 09/13/2022   CO2 28 09/13/2022   TSH 1.18 06/12/2022   HGBA1C 6.0 06/12/2022    VAS [place] ABI WITH/WO TBI  Result Date: 07/19/2022  LOWER EXTREMITY DOPPLER STUDY Patient Name:  Jose Mclaughlin  Date of Exam:   07/19/2022 Medical Rec #: [id number]      Accession #:    [id number] Date of Birth: 10-01-1980     Patient Gender: M Patient Age:   41 years Exam Location:  [facility] Procedure:      VAS [place] ABI WITH/WO TBI Referring Phys: Cristal Deer DICKSON --------------------------------------------------------------------------------  Indications: Unable to palpate pedal pulses High Risk Factors: Hypertension.  Limitations: Today's exam was limited due to Depth of vessels,  elephantiasis of              bilateral lower extremities. Comparison Study: No prior study Performing Technologist: Gertie Fey MHA, RVT, RDCS, RDMS  Examination Guidelines: A complete evaluation includes at minimum, Doppler waveform signals and systolic blood pressure reading at the level of bilateral brachial, anterior tibial, and posterior tibial arteries, when vessel segments are accessible. Bilateral testing is considered an integral part of a complete examination. Photoelectric Plethysmograph (PPG) waveforms and toe systolic pressure readings are included as required and additional duplex testing as needed. Limited examinations for reoccurring indications may be performed as noted.  ABI Findings: +---------+------------------+-----+---------+--------+ Right    Rt Pressure (mmHg)IndexWaveform Comment  +---------+------------------+-----+---------+--------+ Brachial 176                                      +---------+------------------+-----+---------+--------+ PTA      192               1.07 triphasic         +---------+------------------+-----+---------+--------+ DP       183               1.02 triphasic         +---------+------------------+-----+---------+--------+ Great Toe140               0.78                   +---------+------------------+-----+---------+--------+ +---------+------------------+-----+---------+-------+ Left     Lt Pressure (mmHg)IndexWaveform Comment +---------+------------------+-----+---------+-------+ Brachial 180                                     +---------+------------------+-----+---------+-------+ PTA      168               0.93 triphasic        +---------+------------------+-----+---------+-------+ DP       146               0.81 triphasic        +---------+------------------+-----+---------+-------+ Great Toe134               0.74                  +---------+------------------+-----+---------+-------+  +-------+-----------+-----------+------------+------------+ ABI/TBIToday's ABIToday's TBIPrevious ABIPrevious TBI +-------+-----------+-----------+------------+------------+ Right  1.07       0.78                                +-------+-----------+-----------+------------+------------+ Left   0.93  Subjective:  Patient ID: Jose Mclaughlin, male    DOB: [birth year]  Age: 42 y.o. MRN: [id number]  CC: Hypertension   HPI Romeo Gabhart presents for f/up ----  He is active and denies DOE, CP, SOB.   Outpatient Medications Prior to Visit  Medication Sig Dispense Refill   olmesartan (BENICAR) 40 MG tablet TAKE 1 TABLET(40 MG) BY MOUTH DAILY 90 tablet 1   triamterene-hydrochlorothiazide (DYAZIDE) 37.5-25 MG capsule TAKE 1 CAPSULE EVERY DAY 90 capsule 0   tirzepatide (MOUNJARO) 2.5 MG/0.5ML Pen Inject 2.5 mg into the skin once a week. (Patient not taking: Reported on 07/19/2022) 2 mL 0   No facility-administered medications prior to visit.    ROS Review of Systems  Constitutional:  Negative for diaphoresis and fatigue.  HENT: Negative.    Eyes: Negative.   Respiratory: Negative.  Negative for chest tightness and shortness of breath.   Cardiovascular:  Positive for leg swelling. Negative for chest pain and palpitations.  Gastrointestinal:  Negative for abdominal pain, diarrhea, nausea and vomiting.  Genitourinary: Negative.  Negative for difficulty urinating.  Musculoskeletal: Negative.   Skin: Negative.   Neurological: Negative.  Negative for dizziness.  Hematological:  Negative for adenopathy. Does not bruise/bleed easily.  Psychiatric/Behavioral: Negative.      Objective:  BP 122/78 (BP Location: Right Arm, Patient Position: Sitting, Cuff Size: Large)   Pulse 71   Temp 97.9 F (36.6 C) (Oral)   Ht 6' 1.25" (1.861 m)   Wt (!) 519 lb (235.4 kg)   SpO2 90%   BMI 68.01 kg/m   BP Readings from Last 3 Encounters:  09/13/22 122/78  08/23/22 120/79  07/19/22 124/84    Wt Readings from Last 3 Encounters:  09/13/22 (!) 519 lb (235.4 kg)  08/23/22 (!) 512 lb 8 oz (232.5 kg)  07/19/22 (!) 521 lb 12.8 oz (236.7 kg)    Physical Exam Vitals reviewed.  Constitutional:      Appearance: Normal appearance.  HENT:     Mouth/Throat:     Mouth: Mucous membranes are moist.   Eyes:     General: No scleral icterus.    Conjunctiva/sclera: Conjunctivae normal.  Cardiovascular:     Rate and Rhythm: Normal rate and regular rhythm.     Heart sounds: No murmur heard.    Comments: +++ elephantiasis BLE Pulmonary:     Effort: Pulmonary effort is normal.     Breath sounds: No stridor. No wheezing, rhonchi or rales.  Abdominal:     General: Abdomen is protuberant. Bowel sounds are normal. There is no distension.     Palpations: Abdomen is soft. There is no hepatomegaly, splenomegaly or mass.  Musculoskeletal:        General: Normal range of motion.     Cervical back: Neck supple.  Lymphadenopathy:     Cervical: No cervical adenopathy.  Skin:    General: Skin is warm and dry.  Neurological:     General: No focal deficit present.     Mental Status: He is alert. Mental status is at baseline.     Lab Results  Component Value Date   WBC 5.2 06/12/2022   HGB 14.0 06/12/2022   HCT 43.5 06/12/2022   PLT 188.0 06/12/2022   GLUCOSE 99 09/13/2022   CHOL 129 06/08/2021   TRIG 50.0 06/08/2021   HDL 46.60 06/08/2021   LDLCALC 72 06/08/2021   ALT 16 06/12/2022   AST 17 06/12/2022   NA 139 09/13/2022   K 4.0 09/13/2022

## 2022-12-06 ENCOUNTER — Other Ambulatory Visit: Payer: Self-pay | Admitting: Internal Medicine

## 2022-12-06 DIAGNOSIS — I10 Essential (primary) hypertension: Secondary | ICD-10-CM

## 2022-12-07 MED ORDER — OLMESARTAN MEDOXOMIL 40 MG PO TABS
40.0000 mg | ORAL_TABLET | Freq: Every day | ORAL | 0 refills | Status: DC
Start: 2022-12-07 — End: 2023-03-15

## 2022-12-07 MED ORDER — TRIAMTERENE-HCTZ 37.5-25 MG PO CAPS
1.0000 | ORAL_CAPSULE | Freq: Every day | ORAL | 0 refills | Status: DC
Start: 2022-12-07 — End: 2022-12-12

## 2022-12-12 ENCOUNTER — Other Ambulatory Visit: Payer: Self-pay | Admitting: Internal Medicine

## 2022-12-12 DIAGNOSIS — I10 Essential (primary) hypertension: Secondary | ICD-10-CM

## 2023-01-05 ENCOUNTER — Ambulatory Visit (HOSPITAL_COMMUNITY)
Admission: EM | Admit: 2023-01-05 | Discharge: 2023-01-05 | Disposition: A | Discharge status: Home / Self Care | Payer: Self-pay

## 2023-01-05 ENCOUNTER — Encounter (HOSPITAL_COMMUNITY): Payer: Self-pay | Admitting: Emergency Medicine

## 2023-01-05 DIAGNOSIS — R1033 Periumbilical pain: Secondary | ICD-10-CM

## 2023-01-05 NOTE — ED Provider Notes (Signed)
 [facility]    CSN: [id number] Arrival date & time: 01/05/23  1019      History   Chief Complaint Chief Complaint  Patient presents with   Abdominal Pain    HPI Jose Mclaughlin is a 43 y.o. male.   Patient complains of pain above his bellybutton that started yesterday.  He reports intermittent symptoms.  He reports when he lays down and turns to his side he can see a raised area above his bellybutton.  Denies nausea, vomiting, fever, chills.  Denies urinary symptoms.  He does report intermittent diarrhea over the last 4 days.    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 No pertinent surgical history.     Home Medications    Prior to Admission medications   Medication Sig Start Date End Date Taking? Authorizing Provider  olmesartan  (BENICAR ) 40 MG tablet Take 1 tablet (40 mg total) by mouth daily. 12/07/22   Joshua Debby CROME, MD  tirzepatide  (ZEPBOUND ) 2.5 MG/0.5ML Pen Inject 2.5 mg into the skin once a week. 09/13/22   Joshua Debby CROME, MD  triamterene -hydrochlorothiazide (DYAZIDE) 37.5-25 MG capsule TAKE 1 CAPSULE EVERY DAY 12/12/22   Joshua Debby CROME, MD    Family History Family History  Adopted: Yes  Problem Relation Age of Onset   Sleep apnea Neg Hx     Social History Social History   Tobacco Use   Smoking status: Former     Current packs/day: 0.00    Average packs/day: 0.5 packs/day for 23.9 years (12.0 ttl pk-yrs)    Types: Cigarettes    Start date: 03/29/1997    Quit date: 03/02/2021    Years since quitting: 1.8   Smokeless tobacco: Never  Substance Use Topics   Alcohol use: Yes    Alcohol/week: 5.0 standard drinks of alcohol    Types: 5 Cans of beer per week   Drug use: No     Allergies   Patient has no known allergies.   Review of Systems Review of Systems  Constitutional:  Negative for chills and fever.  HENT:  Negative for ear pain and sore throat.   Eyes:  Negative for pain and visual disturbance.  Respiratory:  Negative for cough and shortness of breath.   Cardiovascular:  Negative for chest pain and palpitations.  Gastrointestinal:  Positive for abdominal pain. Negative for vomiting.  Genitourinary:  Negative for dysuria and hematuria.  Musculoskeletal:  Negative for arthralgias and back pain.  Skin:  Negative for color change and rash.  Neurological:  Negative for seizures and syncope.  All other systems reviewed and are negative.    Physical Exam Triage Vital Signs ED Triage Vitals  Encounter Vitals Group     BP 01/05/23 1140 133/82     Systolic BP Percentile --      Diastolic BP Percentile --      Pulse Rate 01/05/23 1140  66     Resp 01/05/23 1140 20     Temp 01/05/23 1140 98.1 F (36.7 C)     Temp Source 01/05/23 1140 Oral     SpO2 01/05/23 1140 98 %     Weight --      Height --      Head Circumference --      Peak Flow --      Pain Score 01/05/23 1138 7     Pain Loc --      Pain Education --      Exclude from Growth Chart --    No data found.  Updated Vital Signs BP 133/82 (BP Location: Right Arm)   Pulse 66   Temp 98.1 F (36.7 C) (Oral)   Resp 20   SpO2 98%   Visual Acuity Right Eye Distance:   Left Eye Distance:   Bilateral Distance:    Right Eye Near:   Left Eye Near:    Bilateral Near:     Physical Exam Vitals and nursing note reviewed.   Constitutional:      General: He is not in acute distress.    Appearance: He is well-developed.  HENT:     Head: Normocephalic and atraumatic.  Eyes:     Conjunctiva/sclera: Conjunctivae normal.  Cardiovascular:     Rate and Rhythm: Normal rate and regular rhythm.     Heart sounds: No murmur heard. Pulmonary:     Effort: Pulmonary effort is normal. No respiratory distress.     Breath sounds: Normal breath sounds.  Abdominal:     Palpations: Abdomen is soft.     Tenderness: There is no abdominal tenderness.     Comments: No hernia noted, no TTP.   Musculoskeletal:        General: No swelling.     Cervical back: Neck supple.  Skin:    General: Skin is warm and dry.     Capillary Refill: Capillary refill takes less than 2 seconds.  Neurological:     Mental Status: He is alert.  Psychiatric:        Mood and Affect: Mood normal.      UC Treatments / Results  Labs (all labs ordered are listed, but only abnormal results are displayed) Labs Reviewed - No data to display  EKG   Radiology No results found.  Ascariasis likes to let them be there now I have to get  Procedures Procedures (including critical care time)  Medications Ordered in UC Medications - No data to display  Initial Impression / Assessment and Plan / UC Course  I have reviewed the triage vital signs and the nursing notes.  Pertinent labs & imaging results that were available during my care of the patient were reviewed by me and considered in my medical decision making (see chart for details).    Abdominal pain.  Possibly has a hernia.  No bulge or hernia noted on abdominal exam at this time.  Minimal tenderness on exam.  Advise follow-up with PCP and ED precautions given. Final Clinical Impressions(s) / UC Diagnoses   Final diagnoses:  Periumbilical abdominal pain     Discharge Instructions      If pain becomes severe go to the Emergency Department for evaluation  If symptoms persists  recommend follow up with primary care physician or general surgery.     ED Prescriptions   None    PDMP not reviewed this encounter.   Ward, Harlene PEDLAR, PA-C 01/05/23 1208

## 2023-01-05 NOTE — ED Triage Notes (Addendum)
 Pt reports yesterday started having umbilical pain. Reports when lays down can see raised area. Denies hx hernia. Pain worse with movement. Reports BMs have been loose. Denies n/v or urinary problems.

## 2023-01-05 NOTE — Discharge Instructions (Signed)
 If pain becomes severe go to the Emergency Department for evaluation  If symptoms persists recommend follow up with primary care physician or general surgery.

## 2023-03-13 ENCOUNTER — Ambulatory Visit: Payer: Commercial Managed Care - HMO | Admitting: Internal Medicine

## 2023-03-13 ENCOUNTER — Encounter: Payer: Self-pay | Admitting: Internal Medicine

## 2023-03-13 VITALS — BP 142/78 | HR 73 | Temp 98.3°F | Resp 16 | Ht 73.25 in | Wt >= 6400 oz

## 2023-03-13 DIAGNOSIS — R7303 Prediabetes: Secondary | ICD-10-CM | POA: Diagnosis not present

## 2023-03-13 DIAGNOSIS — Z6841 Body Mass Index (BMI) 40.0 and over, adult: Secondary | ICD-10-CM | POA: Diagnosis not present

## 2023-03-13 DIAGNOSIS — G4733 Obstructive sleep apnea (adult) (pediatric): Secondary | ICD-10-CM

## 2023-03-13 DIAGNOSIS — I10 Essential (primary) hypertension: Secondary | ICD-10-CM | POA: Diagnosis not present

## 2023-03-13 LAB — CBC WITH DIFFERENTIAL/PLATELET
Basophils Absolute: 0 10*3/uL (ref 0.0–0.1)
Basophils Relative: 0.8 % (ref 0.0–3.0)
Eosinophils Absolute: 0.1 10*3/uL (ref 0.0–0.7)
Eosinophils Relative: 1.2 % (ref 0.0–5.0)
HCT: 42 % (ref 39.0–52.0)
Hemoglobin: 13.8 g/dL (ref 13.0–17.0)
Lymphocytes Relative: 28.6 % (ref 12.0–46.0)
Lymphs Abs: 1.6 10*3/uL (ref 0.7–4.0)
MCHC: 33 g/dL (ref 30.0–36.0)
MCV: 88.8 fl (ref 78.0–100.0)
Monocytes Absolute: 0.4 10*3/uL (ref 0.1–1.0)
Monocytes Relative: 7.7 % (ref 3.0–12.0)
Neutro Abs: 3.4 10*3/uL (ref 1.4–7.7)
Neutrophils Relative %: 61.7 % (ref 43.0–77.0)
Platelets: 194 10*3/uL (ref 150.0–400.0)
RBC: 4.73 Mil/uL (ref 4.22–5.81)
RDW: 15.3 % (ref 11.5–15.5)
WBC: 5.5 10*3/uL (ref 4.0–10.5)

## 2023-03-13 LAB — LIPID PANEL
Cholesterol: 131 mg/dL (ref 0–200)
HDL: 47.1 mg/dL (ref >39.00)
LDL Cholesterol: 70 mg/dL (ref 0–99)
NonHDL: 83.41
Total CHOL/HDL Ratio: 3
Triglycerides: 65 mg/dL (ref 0.0–149.0)
VLDL: 13 mg/dL (ref 0.0–40.0)

## 2023-03-13 LAB — MICROALBUMIN / CREATININE URINE RATIO
Creatinine,U: 142.3 mg/dL
Microalb Creat Ratio: UNDETERMINED mg/g (ref 0.0–30.0)
Microalb, Ur: <0.7 mg/dL

## 2023-03-13 LAB — BASIC METABOLIC PANEL
BUN: 17 mg/dL (ref 6–23)
CO2: 30 meq/L (ref 19–32)
Calcium: 9.2 mg/dL (ref 8.4–10.5)
Chloride: 100 meq/L (ref 96–112)
Creatinine, Ser: 0.79 mg/dL (ref 0.40–1.50)
GFR: 110.51 mL/min (ref >60.00)
Glucose, Bld: 101 mg/dL — ABNORMAL HIGH (ref 70–99)
Potassium: 3.9 meq/L (ref 3.5–5.1)
Sodium: 138 meq/L (ref 135–145)

## 2023-03-13 LAB — URINALYSIS, ROUTINE W REFLEX MICROSCOPIC
Bilirubin Urine: NEGATIVE
Hgb urine dipstick: NEGATIVE
Ketones, ur: NEGATIVE
Leukocytes,Ua: NEGATIVE
Nitrite: NEGATIVE
RBC / HPF: NONE SEEN (ref 0–?)
Specific Gravity, Urine: 1.025 (ref 1.000–1.030)
Total Protein, Urine: NEGATIVE
Urine Glucose: NEGATIVE
Urobilinogen, UA: 0.2 (ref 0.0–1.0)
pH: 6 (ref 5.0–8.0)

## 2023-03-13 LAB — HEPATIC FUNCTION PANEL
ALT: 15 U/L (ref 0–53)
AST: 15 U/L (ref 0–37)
Albumin: 4.1 g/dL (ref 3.5–5.2)
Alkaline Phosphatase: 58 U/L (ref 39–117)
Bilirubin, Direct: 0.1 mg/dL (ref 0.0–0.3)
Total Bilirubin: 0.5 mg/dL (ref 0.2–1.2)
Total Protein: 7.6 g/dL (ref 6.0–8.3)

## 2023-03-13 LAB — TSH: TSH: 1.12 u[IU]/mL (ref 0.35–5.50)

## 2023-03-13 LAB — HEMOGLOBIN A1C: Hgb A1c MFr Bld: 6.3 % (ref 4.6–6.5)

## 2023-03-13 NOTE — Patient Instructions (Signed)
 Hypertension, Adult High blood pressure (hypertension) is when the force of blood pumping through the arteries is too strong. The arteries are the blood vessels that carry blood from the heart throughout the body. Hypertension forces the heart to work harder to pump blood and may cause arteries to become narrow or stiff. Untreated or uncontrolled hypertension can lead to a heart attack, heart failure, a stroke, kidney disease, and other problems. A blood pressure reading consists of a higher number over a lower number. Ideally, your blood pressure should be below 120/80. The first ("top") number is called the systolic pressure. It is a measure of the pressure in your arteries as your heart beats. The second ("bottom") number is called the diastolic pressure. It is a measure of the pressure in your arteries as the heart relaxes. What are the causes? The exact cause of this condition is not known. There are some conditions that result in high blood pressure. What increases the risk? Certain factors may make you more likely to develop high blood pressure. Some of these risk factors are under your control, including: Smoking. Not getting enough exercise or physical activity. Being overweight. Having too much fat, sugar, calories, or salt (sodium) in your diet. Drinking too much alcohol. Other risk factors include: Having a personal history of heart disease, diabetes, high cholesterol, or kidney disease. Stress. Having a family history of high blood pressure and high cholesterol. Having obstructive sleep apnea. Age. The risk increases with age. What are the signs or symptoms? High blood pressure may not cause symptoms. Very high blood pressure (hypertensive crisis) may cause: Headache. Fast or irregular heartbeats (palpitations). Shortness of breath. Nosebleed. Nausea and vomiting. Vision changes. Severe chest pain, dizziness, and seizures. How is this diagnosed? This condition is diagnosed by  measuring your blood pressure while you are seated, with your arm resting on a flat surface, your legs uncrossed, and your feet flat on the floor. The cuff of the blood pressure monitor will be placed directly against the skin of your upper arm at the level of your heart. Blood pressure should be measured at least twice using the same arm. Certain conditions can cause a difference in blood pressure between your right and left arms. If you have a high blood pressure reading during one visit or you have normal blood pressure with other risk factors, you may be asked to: Return on a different day to have your blood pressure checked again. Monitor your blood pressure at home for 1 week or longer. If you are diagnosed with hypertension, you may have other blood or imaging tests to help your health care provider understand your overall risk for other conditions. How is this treated? This condition is treated by making healthy lifestyle changes, such as eating healthy foods, exercising more, and reducing your alcohol intake. You may be referred for counseling on a healthy diet and physical activity. Your health care provider may prescribe medicine if lifestyle changes are not enough to get your blood pressure under control and if: Your systolic blood pressure is above 130. Your diastolic blood pressure is above 80. Your personal target blood pressure may vary depending on your medical conditions, your age, and other factors. Follow these instructions at home: Eating and drinking  Eat a diet that is high in fiber and potassium, and low in sodium, added sugar, and fat. An example of this eating plan is called the DASH diet. DASH stands for Dietary Approaches to Stop Hypertension. To eat this way: Eat  plenty of fresh fruits and vegetables. Try to fill one half of your plate at each meal with fruits and vegetables. Eat whole grains, such as whole-wheat pasta, brown rice, or whole-grain bread. Fill about one  fourth of your plate with whole grains. Eat or drink low-fat dairy products, such as skim milk or low-fat yogurt. Avoid fatty cuts of meat, processed or cured meats, and poultry with skin. Fill about one fourth of your plate with lean proteins, such as fish, chicken without skin, beans, eggs, or tofu. Avoid pre-made and processed foods. These tend to be higher in sodium, added sugar, and fat. Reduce your daily sodium intake. Many people with hypertension should eat less than 1,500 mg of sodium a day. Do not drink alcohol if: Your health care provider tells you not to drink. You are pregnant, may be pregnant, or are planning to become pregnant. If you drink alcohol: Limit how much you have to: 0-1 drink a day for women. 0-2 drinks a day for men. Know how much alcohol is in your drink. In the [place] one drink equals one 12 oz bottle of beer (355 mL), one 5 oz glass of wine (148 mL), or one 1 oz glass of hard liquor (44 mL). Lifestyle  Work with your health care provider to maintain a healthy body weight or to lose weight. Ask what an ideal weight is for you. Get at least 30 minutes of exercise that causes your heart to beat faster (aerobic exercise) most days of the week. Activities may include walking, swimming, or biking. Include exercise to strengthen your muscles (resistance exercise), such as Pilates or lifting weights, as part of your weekly exercise routine. Try to do these types of exercises for 30 minutes at least 3 days a week. Do not use any products that contain nicotine or tobacco. These products include cigarettes, chewing tobacco, and vaping devices, such as e-cigarettes. If you need help quitting, ask your health care provider. Monitor your blood pressure at home as told by your health care provider. Keep all follow-up visits. This is important. Medicines Take over-the-counter and prescription medicines only as told by your health care provider. Follow directions carefully. Blood  pressure medicines must be taken as prescribed. Do not skip doses of blood pressure medicine. Doing this puts you at risk for problems and can make the medicine less effective. Ask your health care provider about side effects or reactions to medicines that you should watch for. Contact a health care provider if you: Think you are having a reaction to a medicine you are taking. Have headaches that keep coming back (recurring). Feel dizzy. Have swelling in your ankles. Have trouble with your vision. Get help right away if you: Develop a severe headache or confusion. Have unusual weakness or numbness. Feel faint. Have severe pain in your chest or abdomen. Vomit repeatedly. Have trouble breathing. These symptoms may be an emergency. Get help right away. Call 911. Do not wait to see if the symptoms will go away. Do not drive yourself to the hospital. Summary Hypertension is when the force of blood pumping through your arteries is too strong. If this condition is not controlled, it may put you at risk for serious complications. Your personal target blood pressure may vary depending on your medical conditions, your age, and other factors. For most people, a normal blood pressure is less than 120/80. Hypertension is treated with lifestyle changes, medicines, or a combination of both. Lifestyle changes include losing weight, eating a healthy,  low-sodium diet, exercising more, and limiting alcohol. This information is not intended to replace advice given to you by your health care provider. Make sure you discuss any questions you have with your health care provider. Document Revised: 10/25/2020 Document Reviewed: 10/25/2020 Elsevier Patient Education  2024 [employer].

## 2023-03-13 NOTE — Progress Notes (Signed)
 Subjective:  Patient ID: Jose Mclaughlin, male    DOB: [birth year]  Age: 43 y.o. MRN: [id number]  CC: Hypertension   HPI Jose Mclaughlin presents for f/up ----  Discussed the use of AI scribe software for clinical note transcription with the patient, who gave verbal consent to proceed.  History of Present Illness   The patient presents for a follow-up regarding vascular issues and hypertension management.  He has experienced improvement in his leg symptoms after consulting with a vascular specialist and using compression garments daily. No chest pain, shortness of breath, dizziness, or lightheadedness are present.  His blood pressure is managed with dyazide and olmesartan, with no reported side effects. Occasionally, he experiences constipation or diarrhea, which he attributes to random occurrences.  He uses a CPAP machine about 50% of the time and notes feeling worse when not using it. He quit smoking two years ago and consumes alcohol sparingly, about six drinks a month.  He has started cooking at home more frequently as part of a weight loss regimen. He was previously noted to be prediabetic, and further lab work is planned to monitor for diabetes development. No blood in urine is observed, and he is vigilant about monitoring for such symptoms.  He has received three COVID vaccines but has not decided on further doses. He has not received a flu vaccine due to a past adverse reaction and is undecided about the pneumonia vaccine.       Outpatient Medications Prior to Visit  Medication Sig Dispense Refill   olmesartan (BENICAR) 40 MG tablet Take 1 tablet (40 mg total) by mouth daily. 90 tablet 0   tirzepatide (ZEPBOUND) 2.5 MG/0.5ML Pen Inject 2.5 mg into the skin once a week. 4 mL 0   triamterene-hydrochlorothiazide (DYAZIDE) 37.5-25 MG capsule TAKE 1 CAPSULE EVERY DAY 90 capsule 0   No facility-administered medications prior to visit.    ROS Review of Systems  Constitutional:   Positive for unexpected weight change (wt gain). Negative for appetite change, chills, diaphoresis and fatigue.  HENT: Negative.    Eyes:  Negative for visual disturbance.  Respiratory:  Positive for apnea. Negative for cough, chest tightness, shortness of breath and wheezing.   Cardiovascular:  Negative for chest pain, palpitations and leg swelling.  Gastrointestinal: Negative.  Negative for abdominal pain, constipation, diarrhea, nausea and vomiting.  Genitourinary:  Negative for difficulty urinating.  Musculoskeletal: Negative.  Negative for arthralgias and myalgias.  Skin: Negative.   Neurological:  Negative for dizziness, weakness, light-headedness and headaches.  Hematological:  Negative for adenopathy. Does not bruise/bleed easily.  Psychiatric/Behavioral: Negative.      Objective:  BP (!) 142/78 (BP Location: Left Arm, Patient Position: Sitting, Cuff Size: Large)   Pulse 73   Temp 98.3 F (36.8 C) (Oral)   Resp 16   Ht 6' 1.25" (1.861 m)   Wt (!) 549 lb (249 kg)   SpO2 97%   BMI 71.94 kg/m   BP Readings from Last 3 Encounters:  03/13/23 (!) 142/78  01/05/23 133/82  09/13/22 122/78    Wt Readings from Last 3 Encounters:  03/13/23 (!) 549 lb (249 kg)  09/13/22 (!) 519 lb (235.4 kg)  08/23/22 (!) 512 lb 8 oz (232.5 kg)    Physical Exam Vitals reviewed.  Constitutional:      Appearance: He is obese.  HENT:     Nose: Nose normal.     Mouth/Throat:     Mouth: Mucous membranes are moist.  Eyes:  General: No scleral icterus.    Conjunctiva/sclera: Conjunctivae normal.  Cardiovascular:     Rate and Rhythm: Normal rate and regular rhythm.     Heart sounds: No murmur heard.    No friction rub. No gallop.  Pulmonary:     Effort: Pulmonary effort is normal.     Breath sounds: No stridor. No wheezing, rhonchi or rales.  Abdominal:     General: Abdomen is protuberant. Bowel sounds are normal. There is no distension.     Palpations: Abdomen is soft. There is no  hepatomegaly, splenomegaly or mass.     Tenderness: There is no abdominal tenderness.  Musculoskeletal:        General: Normal range of motion.     Cervical back: Neck supple.     Right lower leg: No edema.     Left lower leg: No edema.  Lymphadenopathy:     Cervical: No cervical adenopathy.  Skin:    General: Skin is warm.  Neurological:     General: No focal deficit present.     Mental Status: He is alert.  Psychiatric:        Mood and Affect: Mood normal.        Behavior: Behavior normal.     Lab Results  Component Value Date   WBC 5.5 03/13/2023   HGB 13.8 03/13/2023   HCT 42.0 03/13/2023   PLT 194.0 03/13/2023   GLUCOSE 101 (H) 03/13/2023   CHOL 131 03/13/2023   TRIG 65.0 03/13/2023   HDL 47.10 03/13/2023   LDLCALC 70 03/13/2023   ALT 15 03/13/2023   AST 15 03/13/2023   NA 138 03/13/2023   K 3.9 03/13/2023   CL 100 03/13/2023   CREATININE 0.79 03/13/2023   BUN 17 03/13/2023   CO2 30 03/13/2023   TSH 1.12 03/13/2023   HGBA1C 6.3 03/13/2023   MICROALBUR <0.7 03/13/2023    No results found.  Assessment & Plan:  Morbid obesity with BMI of 60.0-69.9, adult (HCC) -     Hemoglobin A1c; Future -     Hepatic function panel; Future -     Lipid panel; Future -     TSH; Future -     Zepbound; Inject 2.5 mg into the skin once a week.  Dispense: 4 mL; Refill: 0  Primary hypertension- His BP is well controlled. Lytes and renal function are normal. -     Basic metabolic panel; Future -     CBC with Differential/Platelet; Future -     TSH; Future -     Urinalysis, Routine w reflex microscopic; Future -     Olmesartan Medoxomil; Take 1 tablet (40 mg total) by mouth daily.  Dispense: 90 tablet; Refill: 0 -     Triamterene-HCTZ; Take 1 each (1 capsule total) by mouth daily.  Dispense: 90 capsule; Refill: 0  Prediabetes -     Basic metabolic panel; Future -     Hemoglobin A1c; Future -     Microalbumin / creatinine urine ratio; Future  OSA (obstructive sleep  apnea) -     Zepbound; Inject 2.5 mg into the skin once a week.  Dispense: 4 mL; Refill: 0     Follow-up: Return in about 6 months (around 09/13/2023).  Sanda Linger, MD

## 2023-03-15 DIAGNOSIS — G4733 Obstructive sleep apnea (adult) (pediatric): Secondary | ICD-10-CM | POA: Insufficient documentation

## 2023-03-15 MED ORDER — OLMESARTAN MEDOXOMIL 40 MG PO TABS
40.0000 mg | ORAL_TABLET | Freq: Every day | ORAL | 0 refills | Status: DC
Start: 2023-03-15 — End: 2023-06-26

## 2023-03-15 MED ORDER — TRIAMTERENE-HCTZ 37.5-25 MG PO CAPS: 1.0000 | ORAL_CAPSULE | Freq: Every day | ORAL | 0 refills | Status: DC

## 2023-03-15 MED ORDER — ZEPBOUND 2.5 MG/0.5ML ~~LOC~~ SOAJ: 2.5000 mg | SUBCUTANEOUS | 0 refills | Status: AC

## 2023-03-16 ENCOUNTER — Encounter: Payer: Self-pay | Admitting: Internal Medicine

## 2023-04-01 ENCOUNTER — Other Ambulatory Visit: Payer: Self-pay | Admitting: Internal Medicine

## 2023-04-01 DIAGNOSIS — I10 Essential (primary) hypertension: Secondary | ICD-10-CM

## 2023-06-23 ENCOUNTER — Other Ambulatory Visit: Payer: Self-pay | Admitting: Internal Medicine

## 2023-06-23 DIAGNOSIS — I10 Essential (primary) hypertension: Secondary | ICD-10-CM

## 2023-06-25 ENCOUNTER — Other Ambulatory Visit: Payer: Self-pay | Admitting: Internal Medicine

## 2023-06-25 DIAGNOSIS — I10 Essential (primary) hypertension: Secondary | ICD-10-CM

## 2023-09-12 ENCOUNTER — Ambulatory Visit: Admitting: Internal Medicine

## 2023-10-01 ENCOUNTER — Other Ambulatory Visit: Payer: Self-pay | Admitting: Internal Medicine

## 2023-10-01 DIAGNOSIS — I10 Essential (primary) hypertension: Secondary | ICD-10-CM

## 2024-02-03 ENCOUNTER — Other Ambulatory Visit: Payer: Self-pay | Admitting: Internal Medicine

## 2024-02-03 DIAGNOSIS — I10 Essential (primary) hypertension: Secondary | ICD-10-CM
